# Patient Record
Sex: Female | Born: 1980 | Race: White | Hispanic: No | Marital: Married | Smoking: Former smoker
Health system: Southern US, Community
[De-identification: ages and names within clinical notes are randomized; demographics above are authoritative.]

## PROBLEM LIST (undated history)

## (undated) DIAGNOSIS — B9689 Other specified bacterial agents as the cause of diseases classified elsewhere: Secondary | ICD-10-CM

## (undated) DIAGNOSIS — R87629 Unspecified abnormal cytological findings in specimens from vagina: Secondary | ICD-10-CM

## (undated) DIAGNOSIS — Z87898 Personal history of other specified conditions: Secondary | ICD-10-CM

## (undated) DIAGNOSIS — K649 Unspecified hemorrhoids: Secondary | ICD-10-CM

## (undated) DIAGNOSIS — N760 Acute vaginitis: Secondary | ICD-10-CM

## (undated) DIAGNOSIS — G43909 Migraine, unspecified, not intractable, without status migrainosus: Secondary | ICD-10-CM

## (undated) DIAGNOSIS — K589 Irritable bowel syndrome without diarrhea: Secondary | ICD-10-CM

## (undated) DIAGNOSIS — I10 Essential (primary) hypertension: Secondary | ICD-10-CM

## (undated) DIAGNOSIS — N898 Other specified noninflammatory disorders of vagina: Principal | ICD-10-CM

## (undated) DIAGNOSIS — K5900 Constipation, unspecified: Secondary | ICD-10-CM

## (undated) DIAGNOSIS — Z309 Encounter for contraceptive management, unspecified: Secondary | ICD-10-CM

## (undated) DIAGNOSIS — Z975 Presence of (intrauterine) contraceptive device: Secondary | ICD-10-CM

## (undated) DIAGNOSIS — B379 Candidiasis, unspecified: Secondary | ICD-10-CM

## (undated) DIAGNOSIS — A498 Other bacterial infections of unspecified site: Secondary | ICD-10-CM

## (undated) DIAGNOSIS — F988 Other specified behavioral and emotional disorders with onset usually occurring in childhood and adolescence: Secondary | ICD-10-CM

## (undated) DIAGNOSIS — IMO0002 Reserved for concepts with insufficient information to code with codable children: Secondary | ICD-10-CM

## (undated) DIAGNOSIS — L409 Psoriasis, unspecified: Secondary | ICD-10-CM

## (undated) DIAGNOSIS — N9489 Other specified conditions associated with female genital organs and menstrual cycle: Principal | ICD-10-CM

## (undated) DIAGNOSIS — R87619 Unspecified abnormal cytological findings in specimens from cervix uteri: Secondary | ICD-10-CM

## (undated) HISTORY — DX: Migraine, unspecified, not intractable, without status migrainosus: G43.909

## (undated) HISTORY — DX: Unspecified hemorrhoids: K64.9

## (undated) HISTORY — PX: AUGMENTATION MAMMAPLASTY: SUR837

## (undated) HISTORY — DX: Other specified behavioral and emotional disorders with onset usually occurring in childhood and adolescence: F98.8

## (undated) HISTORY — PX: FOOT SURGERY: SHX648

## (undated) HISTORY — DX: Candidiasis, unspecified: B37.9

## (undated) HISTORY — DX: Constipation, unspecified: K59.00

## (undated) HISTORY — DX: Encounter for contraceptive management, unspecified: Z30.9

## (undated) HISTORY — DX: Unspecified abnormal cytological findings in specimens from vagina: R87.629

## (undated) HISTORY — PX: BREAST SURGERY: SHX581

## (undated) HISTORY — DX: Personal history of other specified conditions: Z87.898

## (undated) HISTORY — DX: Other specified conditions associated with female genital organs and menstrual cycle: N94.89

## (undated) HISTORY — DX: Essential (primary) hypertension: I10

## (undated) HISTORY — DX: Psoriasis, unspecified: L40.9

## (undated) HISTORY — DX: Acute vaginitis: N76.0

## (undated) HISTORY — DX: Presence of (intrauterine) contraceptive device: Z97.5

## (undated) HISTORY — DX: Irritable bowel syndrome, unspecified: K58.9

## (undated) HISTORY — DX: Unspecified abnormal cytological findings in specimens from cervix uteri: R87.619

## (undated) HISTORY — DX: Other specified bacterial agents as the cause of diseases classified elsewhere: B96.89

## (undated) HISTORY — DX: Other specified noninflammatory disorders of vagina: N89.8

## (undated) HISTORY — DX: Other bacterial infections of unspecified site: A49.8

## (undated) HISTORY — DX: Reserved for concepts with insufficient information to code with codable children: IMO0002

---

## 2002-02-06 ENCOUNTER — Other Ambulatory Visit: Admission: RE | Admit: 2002-02-06 | Discharge: 2002-02-06 | Payer: Self-pay | Admitting: *Deleted

## 2002-07-26 ENCOUNTER — Ambulatory Visit (HOSPITAL_COMMUNITY): Admission: RE | Admit: 2002-07-26 | Discharge: 2002-07-26 | Payer: Self-pay | Admitting: Family Medicine

## 2002-07-26 ENCOUNTER — Encounter: Payer: Self-pay | Admitting: Family Medicine

## 2002-09-26 ENCOUNTER — Other Ambulatory Visit: Admission: RE | Admit: 2002-09-26 | Discharge: 2002-09-26 | Payer: Self-pay | Admitting: *Deleted

## 2008-08-21 ENCOUNTER — Other Ambulatory Visit: Admission: RE | Admit: 2008-08-21 | Discharge: 2008-08-21 | Payer: Self-pay | Admitting: Obstetrics and Gynecology

## 2009-10-07 ENCOUNTER — Other Ambulatory Visit: Admission: RE | Admit: 2009-10-07 | Discharge: 2009-10-07 | Payer: Self-pay | Admitting: Obstetrics and Gynecology

## 2011-05-24 ENCOUNTER — Other Ambulatory Visit (HOSPITAL_COMMUNITY)
Admission: RE | Admit: 2011-05-24 | Discharge: 2011-05-24 | Disposition: A | Discharge status: Home / Self Care | Payer: BC Managed Care – PPO | Source: Ambulatory Visit | Attending: Obstetrics and Gynecology | Admitting: Obstetrics and Gynecology

## 2011-05-24 DIAGNOSIS — R87810 Cervical high risk human papillomavirus (HPV) DNA test positive: Secondary | ICD-10-CM | POA: Insufficient documentation

## 2011-05-24 DIAGNOSIS — Z01419 Encounter for gynecological examination (general) (routine) without abnormal findings: Secondary | ICD-10-CM | POA: Insufficient documentation

## 2012-07-27 ENCOUNTER — Other Ambulatory Visit (HOSPITAL_COMMUNITY)
Admission: RE | Admit: 2012-07-27 | Discharge: 2012-07-27 | Disposition: A | Discharge status: Home / Self Care | Payer: BC Managed Care – PPO | Source: Ambulatory Visit | Attending: Obstetrics and Gynecology | Admitting: Obstetrics and Gynecology

## 2012-07-27 DIAGNOSIS — Z01419 Encounter for gynecological examination (general) (routine) without abnormal findings: Secondary | ICD-10-CM | POA: Insufficient documentation

## 2012-07-27 DIAGNOSIS — Z1151 Encounter for screening for human papillomavirus (HPV): Secondary | ICD-10-CM | POA: Insufficient documentation

## 2013-08-08 ENCOUNTER — Encounter: Payer: Self-pay | Admitting: Adult Health

## 2013-08-08 ENCOUNTER — Ambulatory Visit (INDEPENDENT_AMBULATORY_CARE_PROVIDER_SITE_OTHER): Payer: BC Managed Care – PPO | Admitting: Adult Health

## 2013-08-08 ENCOUNTER — Other Ambulatory Visit: Payer: Self-pay | Admitting: Adult Health

## 2013-08-08 VITALS — BP 138/88 | HR 72 | Ht 65.0 in | Wt 137.0 lb

## 2013-08-08 DIAGNOSIS — G43909 Migraine, unspecified, not intractable, without status migrainosus: Secondary | ICD-10-CM

## 2013-08-08 DIAGNOSIS — Z01419 Encounter for gynecological examination (general) (routine) without abnormal findings: Secondary | ICD-10-CM

## 2013-08-08 DIAGNOSIS — Z309 Encounter for contraceptive management, unspecified: Secondary | ICD-10-CM

## 2013-08-08 DIAGNOSIS — Z87898 Personal history of other specified conditions: Secondary | ICD-10-CM

## 2013-08-08 DIAGNOSIS — K589 Irritable bowel syndrome without diarrhea: Secondary | ICD-10-CM | POA: Insufficient documentation

## 2013-08-08 HISTORY — DX: Encounter for contraceptive management, unspecified: Z30.9

## 2013-08-08 HISTORY — DX: Personal history of other specified conditions: Z87.898

## 2013-08-08 MED ORDER — NORETHIN-ETH ESTRAD-FE BIPHAS 1 MG-10 MCG / 10 MCG PO TABS: 1.0000 | ORAL_TABLET | Freq: Every day | ORAL | Status: DC

## 2013-08-08 NOTE — Patient Instructions (Addendum)
Diet and Irritable Bowel Syndrome  No cure has been found for irritable bowel syndrome (IBS). Many options are available to treat the symptoms. Your caregiver will give you the best treatments available for your symptoms. He or she will also encourage you to manage stress and to make changes to your diet. You need to work with your caregiver and Registered Dietician to find the best combination of medicine, diet, counseling, and support to control your symptoms. The following are some diet suggestions. FOODS THAT MAKE IBS WORSE Fatty foods, such as [place] fries. Milk products, such as cheese or ice cream. Chocolate. Alcohol. Caffeine (found in coffee and some sodas). Carbonated drinks, such as soda. If certain foods cause symptoms, you should eat less of them or stop eating them. FOOD JOURNAL  Keep a journal of the foods that seem to cause distress. Write down: What you are eating during the day and when. What problems you are having after eating. When the symptoms occur in relation to your meals. What foods always make you feel badly. Take your notes with you to your caregiver to see if you should stop eating certain foods. FOODS THAT MAKE IBS BETTER Fiber reduces IBS symptoms, especially constipation, because it makes stools soft, bulky, and easier to pass. Fiber is found in bran, bread, cereal, beans, fruit, and vegetables. Examples of foods with fiber include: Apples. Peaches. Pears. Berries. Figs. Broccoli, raw. Cabbage. Carrots. Raw peas. Kidney beans. Lima beans. Whole-grain bread. Whole-grain cereal. Add foods with fiber to your diet a little at a time. This will let your body get used to them. Too much fiber at once might cause gas and swelling of your abdomen. This can trigger symptoms in a person with IBS. Caregivers usually recommend a diet with enough fiber to produce soft, painless bowel movements. High fiber diets may cause gas and bloating. However, these symptoms  often go away within a few weeks, as your body adjusts. In many cases, dietary fiber may lessen IBS symptoms, particularly constipation. However, it may not help pain or diarrhea. High fiber diets keep the colon mildly enlarged (distended) with the added fiber. This may help prevent spasms in the colon. Some forms of fiber also keep water in the stool, thereby preventing hard stools that are difficult to pass.  Besides telling you to eat more foods with fiber, your caregiver may also tell you to get more fiber by taking a fiber pill or drinking water mixed with a special high fiber powder. An example of this is a natural fiber laxative containing psyllium seed.  TIPS Large meals can cause cramping and diarrhea in people with IBS. If this happens to you, try eating 4 or 5 small meals a day, or try eating less at each of your usual 3 meals. It may also help if your meals are low in fat and high in carbohydrates. Examples of carbohydrates are pasta, rice, whole-grain breads and cereals, fruits, and vegetables. If dairy products cause your symptoms to flare up, you can try eating less of those foods. You might be able to handle yogurt better than other dairy products, because it contains bacteria that helps with digestion. Dairy products are an important source of calcium and other nutrients. If you need to avoid dairy products, be sure to talk with a Registered Dietitian about getting these nutrients through other food sources. Drink enough water and fluids to keep your urine clear or pale yellow. This is important, especially if you have diarrhea. FOR MORE  INFORMATION  International Foundation for Functional Gastrointestinal Disorders: www.iffgd.org  National Digestive Diseases Information Clearinghouse: digestive.StageSync.si Document Released: 02/26/2004 Document Revised: 02/28/2012 Document Reviewed: 11/13/2007 [facility] Patient Information 2014 [place], [place]. Migraine Headache A migraine headache is  an intense, throbbing pain on one or both sides of your head. A migraine can last for 30 minutes to several hours. CAUSES  The exact cause of a migraine headache is not always known. However, a migraine may be caused when nerves in the brain become irritated and release chemicals that cause inflammation. This causes pain. SYMPTOMS  Pain on one or both sides of your head.  Pulsating or throbbing pain.  Severe pain that prevents daily activities.  Pain that is aggravated by any physical activity.  Nausea, vomiting, or both.  Dizziness.  Pain with exposure to bright lights, loud noises, or activity.  General sensitivity to bright lights, loud noises, or smells. Before you get a migraine, you may get warning signs that a migraine is coming (aura). An aura may include:  Seeing flashing lights.  Seeing bright spots, halos, or zig-zag lines.  Having tunnel vision or blurred vision.  Having feelings of numbness or tingling.  Having trouble talking.  Having muscle weakness. MIGRAINE TRIGGERS  Alcohol.  Smoking.  Stress.  Menstruation.  Aged cheeses.  Foods or drinks that contain nitrates, glutamate, aspartame, or tyramine.  Lack of sleep.  Chocolate.  Caffeine.  Hunger.  Physical exertion.  Fatigue.  Medicines used to treat chest pain (nitroglycerine), birth control pills, estrogen, and some blood pressure medicines. DIAGNOSIS  A migraine headache is often diagnosed based on:  Symptoms.  Physical examination.  A CT scan or MRI of your head. TREATMENT Medicines may be given for pain and nausea. Medicines can also be given to help prevent recurrent migraines.  HOME CARE INSTRUCTIONS  Only take over-the-counter or prescription medicines for pain or discomfort as directed by your caregiver. The use of long-term narcotics is not recommended.  Lie down in a dark, quiet room when you have a migraine.  Keep a journal to find out what may trigger your  migraine headaches. For example, write down:  What you eat and drink.  How much sleep you get.  Any change to your diet or medicines.  Limit alcohol consumption.  Quit smoking if you smoke.  Get 7 to 9 hours of sleep, or as recommended by your caregiver.  Limit stress.  Keep lights dim if bright lights bother you and make your migraines worse. SEEK IMMEDIATE MEDICAL CARE IF:   Your migraine becomes severe.  You have a fever.  You have a stiff neck.  You have vision loss.  You have muscular weakness or loss of muscle control.  You start losing your balance or have trouble walking.  You feel faint or pass out.  You have severe symptoms that are different from your first symptoms. MAKE SURE YOU:   Understand these instructions.  Will watch your condition.  Will get help right away if you are not doing well or get worse. Document Released: 12/06/2005 Document Revised: 02/28/2012 Document Reviewed: 11/26/2011 [facility] Patient Information 2014 [place], [place]. Start lo loestrin and use condoms Follow up in 2 months

## 2013-08-08 NOTE — Addendum Note (Signed)
Addended by: Cyril Mourning A on: 08/08/2013 04:55 PM   Modules accepted: Orders, Medications

## 2013-08-08 NOTE — Addendum Note (Signed)
Addended by: Criss Alvine on: 08/08/2013 05:18 PM   Modules accepted: Orders

## 2013-08-08 NOTE — Progress Notes (Signed)
Patient ID: Sharon Bush, female   DOB: [birth year], 32 y.o.   MRN: [id number] History of Present Illness: Sharon Bush is a 32 year old white female married, in for physical.She had a normal pap with negative HPV 8/13. She is the Optician, dispensing at [employer].She is complaining of headaches when she is off her pills,which is oscella, so bad that she goes to bed.She says that she has migraine with ?aura now, some vision change no numbness.   Current Medications, Allergies, Past Medical History, Past Surgical History, Family History and Social History were reviewed in [employer] record.     Review of Systems: Patient denies any blurred vision, shortness of breath, chest pain, abdominal pain, problems with urination, or intercourse. She has IBS with constipation at present but has diarrhea at times.No joint pain or mood swings.See positives in HPI.    Physical Exam:BP 138/88  Pulse 72  Ht 5\' 5"  (1.651 m)  Wt 137 lb (62.143 kg)  BMI 22.8 kg/m2  LMP 07/21/2014BP recheck was 124/88. General:  Well developed, well nourished, no acute distress Skin:  Warm and dry Neck:  Midline trachea, normal thyroid Lungs; Clear to auscultation bilaterally Breast:  No dominant palpable mass, retraction, or nipple discharge,she has breast implants bilaterally Cardiovascular: Regular rate and rhythm Abdomen:  Soft, non tender, no hepatosplenomegaly Pelvic:  External genitalia is normal in appearance.  The vagina is normal in appearance.  The cervix is nullipareous.  Uterus is felt to be normal size, shape, and contour.  No  adnexal masses or tenderness noted. Extremities:  No swelling or varicosities noted Psych:  Alert and cooperative,seems happy Discussed with Dr Despina Hidden her headaches and pills and wil try lo loestrin x 2 months to see if better.  Impression: Yearly exam-no pap Contraceptive management IBS-C Migraines History of abnormal pap    Plan: Physical in 1 year Start lo  loestrin this Sunday and use condoms Number of samples 2 Lot number [id number]  Exp date 9/15. Return in 2 months for follow up Try bene fiber or 1/2 cup applesauce, 1/2 cup prune juice and 1/2 cup real oatmeal. Review handouts on headaches and IBS

## 2013-10-02 ENCOUNTER — Encounter: Payer: Self-pay | Admitting: Adult Health

## 2013-10-02 ENCOUNTER — Ambulatory Visit (INDEPENDENT_AMBULATORY_CARE_PROVIDER_SITE_OTHER): Payer: BC Managed Care – PPO | Admitting: Adult Health

## 2013-10-02 VITALS — BP 120/80 | Ht 65.5 in | Wt 138.0 lb

## 2013-10-02 DIAGNOSIS — Z3049 Encounter for surveillance of other contraceptives: Secondary | ICD-10-CM

## 2013-10-02 DIAGNOSIS — Z309 Encounter for contraceptive management, unspecified: Secondary | ICD-10-CM

## 2013-10-02 NOTE — Progress Notes (Signed)
Subjective:     Patient ID: Sharon Bush, female   DOB: 03-Mar-1981, 32 y.o.   MRN: [id number]  HPI Sharon Bush is back in follow up of changing pills.She is taking lo loestrin and has not had a head or PMS but has been moody, decrease in libido, just not her self.Not interested in children right now, going to get her doctorate.  Review of Systems See HPI  Reviewed past medical,surgical, social and family history. Reviewed medications and allergies.     Objective:   Physical Exam BP 120/80  Ht 5' 5.5" (1.664 m)  Wt 138 lb (62.596 kg)  BMI 22.61 kg/m2  LMP 09/07/2013   pt wants to change birth control discussed other pills, IUD and nexplanon and she thinks maybe IUD I gave her a pack of minastrin,lot [id number],exp 2/16, and the brochure on mirena IUD  Assessment:     Contraceptive management    Plan:     Call with menses for IUD or start minastrin after this pack of pills Review handout on IUD and talk with husband

## 2013-10-02 NOTE — Patient Instructions (Signed)

## 2013-10-08 ENCOUNTER — Ambulatory Visit: Payer: BC Managed Care – PPO | Admitting: Adult Health

## 2013-10-09 ENCOUNTER — Encounter (INDEPENDENT_AMBULATORY_CARE_PROVIDER_SITE_OTHER): Payer: Self-pay

## 2013-10-09 ENCOUNTER — Ambulatory Visit: Payer: BC Managed Care – PPO | Admitting: Adult Health

## 2013-10-11 ENCOUNTER — Ambulatory Visit (INDEPENDENT_AMBULATORY_CARE_PROVIDER_SITE_OTHER): Payer: BC Managed Care – PPO | Admitting: Advanced Practice Midwife

## 2013-10-11 ENCOUNTER — Encounter: Payer: Self-pay | Admitting: Advanced Practice Midwife

## 2013-10-11 VITALS — Ht 65.5 in | Wt 136.0 lb

## 2013-10-11 DIAGNOSIS — Z3200 Encounter for pregnancy test, result unknown: Secondary | ICD-10-CM

## 2013-10-11 DIAGNOSIS — Z3202 Encounter for pregnancy test, result negative: Secondary | ICD-10-CM

## 2013-10-11 DIAGNOSIS — Z30430 Encounter for insertion of intrauterine contraceptive device: Secondary | ICD-10-CM

## 2013-10-11 LAB — POCT URINE PREGNANCY: Preg Test, Ur: NEGATIVE

## 2013-10-11 NOTE — Progress Notes (Signed)
Sharon Bush is a 32 y.o. year old Caucasian female Gravida 0 Para 0  who presents for placement of a Mirena IUD. Her LMP was 10/06/13 and her pregnancy test today is negative.  The risks and benefits of the method and placement have been thouroughly reviewed with the patient and all questions were answered.  Specifically the patient is aware of failure rate of 12/998, expulsion of the IUD and of possible perforation.  The patient is aware of irregular bleeding due to the method and understands the incidence of irregular bleeding diminishes with time.  Time out was performed.  A Graves speculum was placed.  The cervix was prepped using Betadine. The uterus was found to be neutral and it sounded to 7 cm.  The cervix was grasped with a tenaculum and the IUD was inserted to 7 cm.  It was pulled back 1 cm and the IUD was disengaged.  The strings were trimmed to 3 cm.  Sonogram was performed and the proper placement of the IUD was verified.  The patient was instructed on signs and symptoms of infection and to check for the strings after each menses or each month.  The patient is to refrain from intercourse for 3 days.  The patient is scheduled for a return appointment after her first menses or 4 weeks.  CRESENZO-DISHMAN,[name] 10/11/2013 3:39 PM

## 2013-10-11 NOTE — Addendum Note (Signed)
Addended by: Jacklyn Shell on: 10/11/2013 03:41 PM   Modules accepted: Orders, Medications

## 2013-11-08 ENCOUNTER — Encounter (INDEPENDENT_AMBULATORY_CARE_PROVIDER_SITE_OTHER): Payer: Self-pay

## 2013-11-08 ENCOUNTER — Ambulatory Visit (INDEPENDENT_AMBULATORY_CARE_PROVIDER_SITE_OTHER): Payer: BC Managed Care – PPO | Admitting: Advanced Practice Midwife

## 2013-11-08 ENCOUNTER — Encounter: Payer: Self-pay | Admitting: Advanced Practice Midwife

## 2013-11-08 VITALS — BP 120/80 | Wt 129.0 lb

## 2013-11-08 DIAGNOSIS — Z30431 Encounter for routine checking of intrauterine contraceptive device: Secondary | ICD-10-CM

## 2013-11-08 NOTE — Progress Notes (Signed)
Sharon Bush 32 y.o. Got IUD 4 weeks ago.  Has had some brown spotting since Monday, but no complaints.  Tried to check strings, but "I didn't know what I was feeling for."   Past Medical History  Diagnosis Date  . Psoriasis   . Abnormal Pap smear   . Migraines   . IBS (irritable bowel syndrome)   . ADD (attention deficit disorder)   . Contraceptive management 08/08/2013  . History of abnormal Pap smear 08/08/2013   Past Surgical History  Procedure Laterality Date  . Foot surgery    . Breast surgery      augmentation    SSE:  Strings visible, wrapped around back of cervix.  A/P:  Normal IUD check.  Aware that she can call for a rx for megace prn heavy bleeding,

## 2014-01-21 ENCOUNTER — Telehealth: Payer: Self-pay | Admitting: Obstetrics and Gynecology

## 2014-01-21 NOTE — Telephone Encounter (Signed)
Left message x 1. JSY 

## 2014-01-21 NOTE — Telephone Encounter (Signed)
Spoke with pt. Is in a bicycle class. Has developed a small bump in vaginal area that is tender. Call transferred to front desk to schedule an appt. JSY

## 2014-01-22 ENCOUNTER — Encounter: Payer: Self-pay | Admitting: Adult Health

## 2014-01-22 ENCOUNTER — Ambulatory Visit (INDEPENDENT_AMBULATORY_CARE_PROVIDER_SITE_OTHER): Payer: BC Managed Care – PPO | Admitting: Adult Health

## 2014-01-22 VITALS — BP 130/82 | Ht 65.5 in | Wt 129.0 lb

## 2014-01-22 DIAGNOSIS — Z30431 Encounter for routine checking of intrauterine contraceptive device: Secondary | ICD-10-CM

## 2014-01-22 DIAGNOSIS — Z975 Presence of (intrauterine) contraceptive device: Secondary | ICD-10-CM | POA: Insufficient documentation

## 2014-01-22 DIAGNOSIS — K649 Unspecified hemorrhoids: Secondary | ICD-10-CM

## 2014-01-22 DIAGNOSIS — Z1212 Encounter for screening for malignant neoplasm of rectum: Secondary | ICD-10-CM

## 2014-01-22 DIAGNOSIS — K589 Irritable bowel syndrome without diarrhea: Secondary | ICD-10-CM | POA: Insufficient documentation

## 2014-01-22 HISTORY — DX: Unspecified hemorrhoids: K64.9

## 2014-01-22 HISTORY — DX: Presence of (intrauterine) contraceptive device: Z97.5

## 2014-01-22 LAB — HEMOCCULT GUIAC POC 1CARD (OFFICE): Fecal Occult Blood, POC: NEGATIVE

## 2014-01-22 MED ORDER — LINACLOTIDE 145 MCG PO CAPS: 145.0000 ug | ORAL_CAPSULE | Freq: Every day | ORAL | Status: DC

## 2014-01-22 NOTE — Patient Instructions (Signed)
Irritable Bowel Syndrome Irritable Bowel Syndrome (IBS) is caused by a disturbance of normal bowel function. Other terms used are spastic colon, mucous colitis, and irritable colon. It does not require surgery, nor does it lead to cancer. There is no cure for IBS. But with proper diet, stress reduction, and medication, you will find that your problems (symptoms) will gradually disappear or improve. IBS is a common digestive disorder. It usually appears in late adolescence or early adulthood. Women develop it twice as often as men. CAUSES  After food has been digested and absorbed in the small intestine, waste material is moved into the colon (large intestine). In the colon, water and salts are absorbed from the undigested products coming from the small intestine. The remaining residue, or fecal material, is held for elimination. Under normal circumstances, gentle, rhythmic contractions on the bowel walls push the fecal material along the colon towards the rectum. In IBS, however, these contractions are irregular and poorly coordinated. The fecal material is either retained too long, resulting in constipation, or expelled too soon, producing diarrhea. SYMPTOMS  The most common symptom of IBS is pain. It is typically in the lower left side of the belly (abdomen). But it may occur anywhere in the abdomen. It can be felt as heartburn, backache, or even as a dull pain in the arms or shoulders. The pain comes from excessive bowel-muscle spasms and from the buildup of gas and fecal material in the colon. This pain: Can range from sharp belly (abdominal) cramps to a dull, continuous ache. Usually worsens soon after eating. Is typically relieved by having a bowel movement or passing gas. Abdominal pain is usually accompanied by constipation. But it may also produce diarrhea. The diarrhea typically occurs right after a meal or upon arising in the morning. The stools are typically soft and watery. They are often  flecked with secretions (mucus). Other symptoms of IBS include: Bloating. Loss of appetite. Heartburn. Feeling sick to your stomach (nausea). Belching Vomiting Gas. IBS may also cause a number of symptoms that are unrelated to the digestive system: Fatigue. Headaches. Anxiety Shortness of breath Difficulty in concentrating. Dizziness. These symptoms tend to come and go. DIAGNOSIS  The symptoms of IBS closely mimic the symptoms of other, more serious digestive disorders. So your caregiver may wish to perform a variety of additional tests to exclude these disorders. He/she wants to be certain of learning what is wrong (diagnosis). The nature and purpose of each test will be explained to you. TREATMENT A number of medications are available to help correct bowel function and/or relieve bowel spasms and abdominal pain. Among the drugs available are: Mild, non-irritating laxatives for severe constipation and to help restore normal bowel habits. Specific anti-diarrheal medications to treat severe or prolonged diarrhea. Anti-spasmodic agents to relieve intestinal cramps. Your caregiver may also decide to treat you with a mild tranquilizer or sedative during unusually stressful periods in your life. The important thing to remember is that if any drug is prescribed for you, make sure that you take it exactly as directed. Make sure that your caregiver knows how well it worked for you. HOME CARE INSTRUCTIONS  Avoid foods that are high in fat or oils. Some examples WGN:FAOZHare:heavy cream, butter, frankfurters, sausage, and other fatty meats. Avoid foods that have a laxative effect, such as fruit, fruit juice, and dairy products. Cut out carbonated drinks, chewing gum, and "gassy" foods, such as beans and cabbage. This may help relieve bloating and belching. Bran taken with plenty  of liquids may help relieve constipation. Keep track of what foods seem to trigger your symptoms. Avoid emotionally charged  situations or circumstances that produce anxiety. Start or continue exercising. Get plenty of rest and sleep. MAKE SURE YOU:  Understand these instructions. Will watch your condition. Will get help right away if you are not doing well or get worse. Document Released: 12/06/2005 Document Revised: 02/28/2012 Document Reviewed: 07/26/2008 [facility] Patient Information 2014 [place], [place]. Hemorrhoids Hemorrhoids are swollen veins around the rectum or anus. There are two types of hemorrhoids:   Internal hemorrhoids. These occur in the veins just inside the rectum. They may poke through to the outside and become irritated and painful.  External hemorrhoids. These occur in the veins outside the anus and can be felt as a painful swelling or hard lump near the anus. CAUSES  Pregnancy.   Obesity.   Constipation or diarrhea.   Straining to have a bowel movement.   Sitting for long periods on the toilet.  Heavy lifting or other activity that caused you to strain.  Anal intercourse. SYMPTOMS   Pain.   Anal itching or irritation.   Rectal bleeding.   Fecal leakage.   Anal swelling.   One or more lumps around the anus.  DIAGNOSIS  Your caregiver may be able to diagnose hemorrhoids by visual examination. Other examinations or tests that may be performed include:   Examination of the rectal area with a gloved hand (digital rectal exam).   Examination of anal canal using a small tube (scope).   A blood test if you have lost a significant amount of blood.  A test to look inside the colon (sigmoidoscopy or colonoscopy). TREATMENT Most hemorrhoids can be treated at home. However, if symptoms do not seem to be getting better or if you have a lot of rectal bleeding, your caregiver may perform a procedure to help make the hemorrhoids get smaller or remove them completely. Possible treatments include:   Placing a rubber band at the base of the hemorrhoid to cut off the  circulation (rubber band ligation).   Injecting a chemical to shrink the hemorrhoid (sclerotherapy).   Using a tool to burn the hemorrhoid (infrared light therapy).   Surgically removing the hemorrhoid (hemorrhoidectomy).   Stapling the hemorrhoid to block blood flow to the tissue (hemorrhoid stapling).  HOME CARE INSTRUCTIONS   Eat foods with fiber, such as whole grains, beans, nuts, fruits, and vegetables. Ask your doctor about taking products with added fiber in them (fibersupplements).  Increase fluid intake. Drink enough water and fluids to keep your urine clear or pale yellow.   Exercise regularly.   Go to the bathroom when you have the urge to have a bowel movement. Do not wait.   Avoid straining to have bowel movements.   Keep the anal area dry and clean. Use wet toilet paper or moist towelettes after a bowel movement.   Medicated creams and suppositories may be used or applied as directed.   Only take over-the-counter or prescription medicines as directed by your caregiver.   Take warm sitz baths for 15 20 minutes, 3 4 times a day to ease pain and discomfort.   Place ice packs on the hemorrhoids if they are tender and swollen. Using ice packs between sitz baths may be helpful.   Put ice in a plastic bag.   Place a towel between your skin and the bag.   Leave the ice on for 15 20 minutes, 3 4 times a  day.   Do not use a donut-shaped pillow or sit on the toilet for long periods. This increases blood pooling and pain.  SEEK MEDICAL CARE IF:  You have increasing pain and swelling that is not controlled by treatment or medicine.  You have uncontrolled bleeding.  You have difficulty or you are unable to have a bowel movement.  You have pain or inflammation outside the area of the hemorrhoids. MAKE SURE YOU:  Understand these instructions.  Will watch your condition.  Will get help right away if you are not doing well or get worse. Document  Released: 12/03/2000 Document Revised: 11/22/2012 Document Reviewed: 10/10/2012 [facility] Patient Information 2014 [place], [place]. Try linzess  Call prn

## 2014-01-22 NOTE — Progress Notes (Signed)
Subjective:     Patient ID: Sharon MiresLeslie Coleman, female   DOB: 02/01/1981, 33 y.o.   MRN: [id number]  HPI Verlon AuLeslie is a 33 year old white female in complaining of sore spot left labia after spin class and ?hemorrhoid and IBS with severe constipation.Has had elevated LFTs and has stopped MTX. Will get labs next week, also MD may try humira.  Review of Systems See HPI Reviewed past medical,surgical, social and family history. Reviewed medications and allergies.     Objective:   Physical Exam BP 130/82  Ht 5' 5.5" (1.664 m)  Wt 129 lb (58.514 kg)  BMI 21.13 kg/m2   Skin warm and dry.Pelvic: external genitalia is normal in appearance, no lesions or bumps, vagina:  Scant discharge without odor, cervix:smooth, +IUD strings at os, uterus: normal size, shape and contour, non tender, no masses felt, adnexa: no masses or tenderness noted.On rectal exam + internal hemorrhoids with negative hemoccult.  Assessment:     IUD in place Hemorrhoids IBS-constipation    Plan:     Try linzess 145 mcg daily Number of samples 20  Lot number [id number] Exp date 3/15   Wear padded spin shorts Review handout on hemorrhoids and IBS and hemorrhoid banding Call in follow up, nay refer back to Dr Jena Gaussourk

## 2014-02-27 ENCOUNTER — Telehealth: Payer: Self-pay | Admitting: *Deleted

## 2014-02-27 MED ORDER — LINACLOTIDE 145 MCG PO CAPS: 145.0000 ug | ORAL_CAPSULE | Freq: Every day | ORAL | Status: DC

## 2014-02-27 NOTE — Telephone Encounter (Signed)
linzess works well will refill

## 2014-08-19 ENCOUNTER — Ambulatory Visit (INDEPENDENT_AMBULATORY_CARE_PROVIDER_SITE_OTHER): Payer: BC Managed Care – PPO | Admitting: Adult Health

## 2014-08-19 ENCOUNTER — Encounter: Payer: Self-pay | Admitting: Adult Health

## 2014-08-19 VITALS — BP 130/90 | HR 78 | Ht 64.5 in | Wt 135.5 lb

## 2014-08-19 DIAGNOSIS — Z01419 Encounter for gynecological examination (general) (routine) without abnormal findings: Secondary | ICD-10-CM

## 2014-08-19 DIAGNOSIS — K5900 Constipation, unspecified: Secondary | ICD-10-CM

## 2014-08-19 DIAGNOSIS — Z975 Presence of (intrauterine) contraceptive device: Secondary | ICD-10-CM

## 2014-08-19 HISTORY — DX: Constipation, unspecified: K59.00

## 2014-08-19 MED ORDER — LINACLOTIDE 145 MCG PO CAPS: 145.0000 ug | ORAL_CAPSULE | Freq: Every day | ORAL | Status: DC

## 2014-08-19 NOTE — Progress Notes (Signed)
Patient ID: Sharon Bush, female   DOB: February 23, 1981, 33 y.o.   MRN: [id number] History of Present Illness: Sharon Bush is a 33 year old white female married in for physical, she had a normal pap with negative HPV 07/27/12.Linzess is helping with constipation.   Current Medications, Allergies, Past Medical History, Past Surgical History, Family History and Social History were reviewed in [employer] record.     Review of Systems: patient denies any headaches, blurred vision, shortness of breath, chest pain, abdominal pain, problems with bowel movements, urination, or intercourse. No joint pain or mood swings.    Physical Exam:BP 130/90  Pulse 78  Ht 5' 4.5" (1.638 m)  Wt 135 lb 8 oz (61.462 kg)  BMI 22.91 kg/m2 General:  Well developed, well nourished, no acute distress Skin:  Warm and dry Neck:  Midline trachea, normal thyroid Lungs; Clear to auscultation bilaterally Breast:  No dominant palpable mass, retraction, or nipple discharge, has bilateral implants  Cardiovascular: Regular rate and rhythm Abdomen:  Soft, non tender, no hepatosplenomegaly Pelvic:  External genitalia is normal in appearance.  The vagina is normal in appearance. The cervix is nulliparous, +IUD strings.  Uterus is felt to be normal size, shape, and contour.  No  adnexal masses or tenderness noted. Extremities:  No swelling or varicosities noted Psych:  No mood changes, alert and cooperative, seems happy   Impression: Yearly gyn exam no pap IUD in place Constipation     Plan: Refilled linzess 145 mcg #30 1 daily with 11 refills Physical and pap in one year Labs at school Mammogram at 39 Check BP at school

## 2014-08-19 NOTE — Patient Instructions (Signed)
Physical and pap in 1 year Mammogram at 22

## 2014-09-10 ENCOUNTER — Telehealth: Payer: Self-pay | Admitting: Adult Health

## 2014-09-10 NOTE — Telephone Encounter (Signed)
Has IUD and had dark brown discharge, no pain or odor, reassured this can happen with IUD and should clear up on its own, if not call me back.

## 2014-12-09 ENCOUNTER — Telehealth: Payer: Self-pay | Admitting: Adult Health

## 2014-12-09 MED ORDER — METRONIDAZOLE 0.75 % VA GEL: VAGINAL | Status: DC

## 2014-12-09 NOTE — Telephone Encounter (Signed)
Has discharge with IUD it is brown, will try Metrogel, if not tcall

## 2014-12-23 ENCOUNTER — Telehealth: Payer: Self-pay | Admitting: Adult Health

## 2014-12-23 MED ORDER — FLUCONAZOLE 150 MG PO TABS: ORAL_TABLET | ORAL | Status: DC

## 2014-12-23 NOTE — Telephone Encounter (Signed)
Spoke with pt. Pt was prescribed Metrogel and now has a yeast infection. Can you order Diflucan? Thanks!! JSY

## 2014-12-23 NOTE — Telephone Encounter (Signed)
Will rx diflucan  

## 2015-01-07 ENCOUNTER — Ambulatory Visit (INDEPENDENT_AMBULATORY_CARE_PROVIDER_SITE_OTHER): Payer: BC Managed Care – PPO | Admitting: Adult Health

## 2015-01-07 ENCOUNTER — Encounter: Payer: Self-pay | Admitting: Adult Health

## 2015-01-07 VITALS — BP 140/90 | Ht 65.5 in | Wt 140.5 lb

## 2015-01-07 DIAGNOSIS — N898 Other specified noninflammatory disorders of vagina: Secondary | ICD-10-CM

## 2015-01-07 DIAGNOSIS — Z975 Presence of (intrauterine) contraceptive device: Secondary | ICD-10-CM

## 2015-01-07 HISTORY — DX: Other specified noninflammatory disorders of vagina: N89.8

## 2015-01-07 LAB — POCT WET PREP (WET MOUNT)

## 2015-01-07 MED ORDER — NORETHIN ACE-ETH ESTRAD-FE 1-20 MG-MCG(24) PO CHEW: CHEWABLE_TABLET | ORAL | Status: DC

## 2015-01-07 NOTE — Patient Instructions (Signed)
Take the minastrin daily Return in 2 months for pap and physical

## 2015-01-07 NOTE — Progress Notes (Signed)
Subjective:     Patient ID: Sharon MiresLeslie Bush, female   DOB: 06/17/1981, 34 y.o.   MRN: [id number]  HPI Sharon Bush is a [age] year old white female in complaining of vaginal discharge/spotting with IUD, has had IUD since 2014.Metrogel cleared it up.May have brown stringy discharge for 1-2 weeks.  Review of Systems See HPI Reviewed past medical,surgical, social and family history. Reviewed medications and allergies.     Objective:   Physical Exam BP 140/90 mmHg  Ht 5' 5.5" (1.664 m)  Wt 140 lb 8 oz (63.73 kg)  BMI 23.02 kg/m2  LMP 12/31/2014   Skin warm and dry.Pelvic: external genitalia is normal in appearance, vagina: scant tan discharge without odor, cervix:smooth,+IUD strings, uterus: normal size, shape and contour, non tender, no masses felt, adnexa: no masses or tenderness noted. Wet prep:negative Explained this may be her period, will try OCs to see if gets better.  Assessment:     Vaginal discharge IUD in place    Plan:     Given 84 tabs(3Packs) of minastrin lot #[id number] A exp 7/16 take 1 daily  Return in in 2 months for pap and physical Keep calendar of discharge/spotting

## 2015-03-18 ENCOUNTER — Other Ambulatory Visit (HOSPITAL_COMMUNITY)
Admission: RE | Admit: 2015-03-18 | Discharge: 2015-03-18 | Disposition: A | Discharge status: Home / Self Care | Payer: BC Managed Care – PPO | Source: Ambulatory Visit | Attending: Adult Health | Admitting: Adult Health

## 2015-03-18 ENCOUNTER — Ambulatory Visit (INDEPENDENT_AMBULATORY_CARE_PROVIDER_SITE_OTHER): Payer: BC Managed Care – PPO | Admitting: Adult Health

## 2015-03-18 ENCOUNTER — Encounter: Payer: Self-pay | Admitting: Adult Health

## 2015-03-18 VITALS — BP 124/80 | HR 104 | Ht 64.5 in | Wt 139.5 lb

## 2015-03-18 DIAGNOSIS — Z30432 Encounter for removal of intrauterine contraceptive device: Secondary | ICD-10-CM | POA: Diagnosis not present

## 2015-03-18 DIAGNOSIS — Z1151 Encounter for screening for human papillomavirus (HPV): Secondary | ICD-10-CM | POA: Diagnosis present

## 2015-03-18 DIAGNOSIS — B379 Candidiasis, unspecified: Secondary | ICD-10-CM

## 2015-03-18 DIAGNOSIS — Z3041 Encounter for surveillance of contraceptive pills: Secondary | ICD-10-CM

## 2015-03-18 DIAGNOSIS — Z01419 Encounter for gynecological examination (general) (routine) without abnormal findings: Secondary | ICD-10-CM | POA: Insufficient documentation

## 2015-03-18 DIAGNOSIS — N898 Other specified noninflammatory disorders of vagina: Secondary | ICD-10-CM

## 2015-03-18 HISTORY — DX: Candidiasis, unspecified: B37.9

## 2015-03-18 LAB — POCT WET PREP (WET MOUNT)

## 2015-03-18 MED ORDER — NORETHIN ACE-ETH ESTRAD-FE 1-20 MG-MCG(24) PO CHEW: CHEWABLE_TABLET | ORAL | Status: DC

## 2015-03-18 MED ORDER — FLUCONAZOLE 150 MG PO TABS: ORAL_TABLET | ORAL | Status: DC

## 2015-03-18 NOTE — Progress Notes (Signed)
Patient ID: Sharon Bush, female   DOB: 10/30/1981, 34 y.o.   MRN: [id number] History of Present Illness: Sharon Bush is a 34 year old white female, married, in for well woman gyn exam and pap.She has been on Mnastrin for irregular bleeding with IUD and it is better, but she wants the IUD removed.She has sinus issues and sneezing and saw PCP today. She thinks may have yeast infection feels itchy.  Current Medications, Allergies, Past Medical History, Past Surgical History, Family History and Social History were reviewed in [name].     Review of Systems: Patient denies any headaches, hearing loss, fatigue, blurred vision, shortness of breath, chest pain, abdominal pain, problems with bowel movements, urination, or intercourse. No joint pain or mood swings.See HPI for positives.    Physical Exam:BP 124/80 mmHg  Pulse 104  Ht 5' 4.5" (1.638 m)  Wt 139 lb 8 oz (63.277 kg)  BMI 23.58 kg/m2  LMP 03/09/2015 General:  Well developed, well nourished, no acute distress Skin:  Warm and dry Neck:  Midline trachea, normal thyroid, good ROM, no lymphadenopathy Lungs; Clear to auscultation bilaterally Breast:  No dominant palpable mass, retraction, or nipple discharge, has bilateral breast implants Cardiovascular: Regular rate and rhythm Abdomen:  Soft, non tender, no hepatosplenomegaly Pelvic:  External genitalia is normal in appearance, no lesions.  The vagina is normal in appearance, with good color, moisture and rugae, has white clumpy discharge. Urethra has no lesions or masses. The cervix is smooth with +IUD stings, pap performed with HPV and IUD strings grasped with forceps and easily removed .  Uterus is felt to be normal size, shape, and contour.  No adnexal masses or tenderness noted.Bladder is non tender, no masses felt.Wet prep:+ yeast and few WBCs Extremities/musculoskeletal:  No swelling or varicosities noted, no clubbing or cyanosis Psych:  No mood changes,  alert and cooperative,seems happy   Impression: Well woman gyn exam with pap  Vaginal discharge  Yeast Contraceptive management IUD removal  Plan: Refilled minastrin x 1 year Rx diflucan 150 mg #2 1 now and 1 in 3 days with 1 refill Physical in 1 year Use condoms when on antibiotics to be safe

## 2015-03-18 NOTE — Patient Instructions (Signed)
Take minastrin Physical in 1 year Use condoms when on antibiotics

## 2015-03-21 LAB — CYTOLOGY - PAP

## 2015-03-24 ENCOUNTER — Telehealth: Payer: Self-pay | Admitting: Adult Health

## 2015-03-24 NOTE — Telephone Encounter (Signed)
Spoke with pt. Pt had IUD removed last week. Is on Minastrin. Has had brown/red spotting that started Friday. I advised it can be normal once you've had IUD removed to have some spotting. I advised to give it a few more days. Pt to call back Thursday if still with spotting. JSY

## 2015-03-27 ENCOUNTER — Telehealth: Payer: Self-pay | Admitting: Adult Health

## 2015-03-27 NOTE — Telephone Encounter (Signed)
Had some spotting after IUD removal ,is taking minastrin, reassured to just watch for now

## 2015-10-22 ENCOUNTER — Telehealth: Payer: Self-pay | Admitting: Adult Health

## 2015-10-22 NOTE — Telephone Encounter (Signed)
Left message letting pt know to try Monistat per JAG. If she don't want to do that, can call office for appt. JSY

## 2016-01-11 ENCOUNTER — Other Ambulatory Visit: Payer: Self-pay | Admitting: Adult Health

## 2016-03-02 ENCOUNTER — Other Ambulatory Visit: Payer: Self-pay | Admitting: Adult Health

## 2016-05-03 ENCOUNTER — Other Ambulatory Visit: Payer: BC Managed Care – PPO | Admitting: Adult Health

## 2016-05-20 ENCOUNTER — Ambulatory Visit (INDEPENDENT_AMBULATORY_CARE_PROVIDER_SITE_OTHER): Payer: BC Managed Care – PPO | Admitting: Adult Health

## 2016-05-20 ENCOUNTER — Encounter: Payer: Self-pay | Admitting: Adult Health

## 2016-05-20 VITALS — BP 126/76 | HR 81 | Ht 65.5 in | Wt 148.5 lb

## 2016-05-20 DIAGNOSIS — A499 Bacterial infection, unspecified: Secondary | ICD-10-CM | POA: Diagnosis not present

## 2016-05-20 DIAGNOSIS — N898 Other specified noninflammatory disorders of vagina: Secondary | ICD-10-CM

## 2016-05-20 DIAGNOSIS — Z01411 Encounter for gynecological examination (general) (routine) with abnormal findings: Secondary | ICD-10-CM

## 2016-05-20 DIAGNOSIS — N760 Acute vaginitis: Secondary | ICD-10-CM

## 2016-05-20 DIAGNOSIS — Z01419 Encounter for gynecological examination (general) (routine) without abnormal findings: Secondary | ICD-10-CM

## 2016-05-20 DIAGNOSIS — B9689 Other specified bacterial agents as the cause of diseases classified elsewhere: Secondary | ICD-10-CM

## 2016-05-20 DIAGNOSIS — Z3041 Encounter for surveillance of contraceptive pills: Secondary | ICD-10-CM

## 2016-05-20 HISTORY — DX: Other specified bacterial agents as the cause of diseases classified elsewhere: B96.89

## 2016-05-20 LAB — POCT WET PREP (WET MOUNT): WBC, Wet Prep HPF POC: POSITIVE

## 2016-05-20 MED ORDER — METRONIDAZOLE 0.75 % VA GEL: 1.0000 | Freq: Every day | VAGINAL | Status: DC

## 2016-05-20 MED ORDER — LINACLOTIDE 145 MCG PO CAPS: 145.0000 ug | ORAL_CAPSULE | Freq: Every day | ORAL | Status: DC

## 2016-05-20 MED ORDER — NORETHIN ACE-ETH ESTRAD-FE 1-20 MG-MCG(24) PO CHEW: CHEWABLE_TABLET | ORAL | Status: DC

## 2016-05-20 NOTE — Patient Instructions (Signed)
Physical in 1 year Pap 2019 Use metrogel Bacterial Vaginosis Bacterial vaginosis is a vaginal infection that occurs when the normal balance of bacteria in the vagina is disrupted. It results from an overgrowth of certain bacteria. This is the most common vaginal infection in women of childbearing age. Treatment is important to prevent complications, especially in pregnant women, as it can cause a premature delivery. CAUSES  Bacterial vaginosis is caused by an increase in harmful bacteria that are normally present in smaller amounts in the vagina. Several different kinds of bacteria can cause bacterial vaginosis. However, the reason that the condition develops is not fully understood. RISK FACTORS Certain activities or behaviors can put you at an increased risk of developing bacterial vaginosis, including:  Having a new sex partner or multiple sex partners.  Douching.  Using an intrauterine device (IUD) for contraception. Women do not get bacterial vaginosis from toilet seats, bedding, swimming pools, or contact with objects around them. SIGNS AND SYMPTOMS  Some women with bacterial vaginosis have no signs or symptoms. Common symptoms include:  Grey vaginal discharge.  A fishlike odor with discharge, especially after sexual intercourse.  Itching or burning of the vagina and vulva.  Burning or pain with urination. DIAGNOSIS  Your health care provider will take a medical history and examine the vagina for signs of bacterial vaginosis. A sample of vaginal fluid may be taken. Your health care provider will look at this sample under a microscope to check for bacteria and abnormal cells. A vaginal pH test may also be done.  TREATMENT  Bacterial vaginosis may be treated with antibiotic medicines. These may be given in the form of a pill or a vaginal cream. A second round of antibiotics may be prescribed if the condition comes back after treatment. Because bacterial vaginosis increases your risk  for sexually transmitted diseases, getting treated can help reduce your risk for chlamydia, gonorrhea, HIV, and herpes. HOME CARE INSTRUCTIONS   Only take over-the-counter or prescription medicines as directed by your health care provider.  If antibiotic medicine was prescribed, take it as directed. Make sure you finish it even if you start to feel better.  Tell all sexual partners that you have a vaginal infection. They should see their health care provider and be treated if they have problems, such as a mild rash or itching.  During treatment, it is important that you follow these instructions:  Avoid sexual activity or use condoms correctly.  Do not douche.  Avoid alcohol as directed by your health care provider.  Avoid breastfeeding as directed by your health care provider. SEEK MEDICAL CARE IF:   Your symptoms are not improving after 3 days of treatment.  You have increased discharge or pain.  You have a fever. MAKE SURE YOU:   Understand these instructions.  Will watch your condition.  Will get help right away if you are not doing well or get worse. FOR MORE INFORMATION  Centers for Disease Control and Prevention, Division of STD Prevention: SolutionApps.co.zawww.cdc.gov/std American Sexual Health Association (ASHA): www.ashastd.org    This information is not intended to replace advice given to you by your health care provider. Make sure you discuss any questions you have with your health care provider.   Document Released: 12/06/2005 Document Revised: 12/27/2014 Document Reviewed: 07/18/2013 Elsevier Interactive Patient Education [employer]2016 Elsevier Inc.

## 2016-05-20 NOTE — Progress Notes (Signed)
Patient ID: Sharon Bush, female   DOB: 05/20/1981, 35 y.o.   MRN: [id number] History of Present Illness: Sharon Bush is a 35 year old white female, married in for a well woman gyn exam, she had a normal pap with negative HPV 03/18/15.She is happy with minastrin, she notices odor and discharge at times esp after working out, she wears thongs.She requests refills on linzess it helps with BMs.   Current Medications, Allergies, Past Medical History, Past Surgical History, Family History and Social History were reviewed in [name].     Review of Systems: Patient denies any headaches, hearing loss, fatigue, blurred vision, shortness of breath, chest pain, abdominal pain, problems with bowel movements(constipated at times), urination, or intercourse. No joint pain or mood swings.See HPI for positives.    Physical Exam:BP 126/76 mmHg  Pulse 81  Ht 5' 5.5" (1.664 m)  Wt 148 lb 8 oz (67.359 kg)  BMI 24.33 kg/m2  LMP 04/30/2016 General:  Well developed, well nourished, no acute distress Skin:  Warm and dry Neck:  Midline trachea, normal thyroid, good ROM, no lymphadenopathy Lungs; Clear to auscultation bilaterally Breast:  No dominant palpable mass, retraction, or nipple discharge, has bilateral implants Cardiovascular: Regular rate and rhythm Abdomen:  Soft, non tender, no hepatosplenomegaly Pelvic:  External genitalia is normal in appearance, no lesions.  The vagina has red sidewalls and frothy creamy discharge with slight odor, wet prep: +WBCs and clue cells. Urethra has no lesions or masses. The cervix is everted at os.  Uterus is felt to be normal size, shape, and contour.  No adnexal masses or tenderness noted.Bladder is non tender, no masses felt. Extremities/musculoskeletal:  No swelling or varicosities noted, no clubbing or cyanosis Psych:  No mood changes, alert and cooperative,seems happy   Impression: Well woman gyn exam no pap Contraceptive  management Vaginal discharge  BV    Plan: Rx metrogel use 1 applicator in vagina at HS x 5 nights and 1 refill Review handout on BV Refilled linzess 145 mcg take 1 daily with 6 refills Refilled minastrin x 1 year Physical in 1 year, pap 2019  Do not work out in [employer]

## 2016-05-28 ENCOUNTER — Telehealth: Payer: Self-pay | Admitting: Adult Health

## 2016-05-28 MED ORDER — FLUCONAZOLE 150 MG PO TABS: ORAL_TABLET | ORAL | Status: DC

## 2016-05-28 NOTE — Telephone Encounter (Signed)
Pt called stating that Victorino DikeJennifer has prescribed a medication for her a gel cream, Pt states that this medication is causing her have a yeast infection. Pt would like for Victorino DikeJennifer to call something in for her. Please contact pt.

## 2016-05-28 NOTE — Telephone Encounter (Signed)
Will put refill on diflucan

## 2016-05-28 NOTE — Telephone Encounter (Signed)
Spoke with pt. Pt finished Metrogel Monday and now has a yeast infection. Can you order Diflucan? She has gotten a yeast infection before from Metrogel. Thanks!! JSY

## 2016-06-28 ENCOUNTER — Encounter: Payer: Self-pay | Admitting: Adult Health

## 2016-06-28 ENCOUNTER — Ambulatory Visit (INDEPENDENT_AMBULATORY_CARE_PROVIDER_SITE_OTHER): Payer: BC Managed Care – PPO | Admitting: Adult Health

## 2016-06-28 VITALS — BP 130/84 | HR 90 | Ht 65.5 in | Wt 147.0 lb

## 2016-06-28 DIAGNOSIS — N9489 Other specified conditions associated with female genital organs and menstrual cycle: Secondary | ICD-10-CM | POA: Diagnosis not present

## 2016-06-28 HISTORY — DX: Other specified conditions associated with female genital organs and menstrual cycle: N94.89

## 2016-06-28 NOTE — Progress Notes (Signed)
Subjective:     Patient ID: Sharon MiresLeslie Coleman, female   DOB: 01/25/1981, 35 y.o.   MRN: [id number]  HPI Verlon AuLeslie is a 35 year old white female, in for swelling in right labia area, felt like BB, but it has resolved now.   Review of Systems Swelling right labia area, resolved now Reviewed past medical,surgical, social and family history. Reviewed medications and allergies.     Objective:   Physical Exam BP 130/84 mmHg  Pulse 90  Ht 5' 5.5" (1.664 m)  Wt 147 lb (66.679 kg)  BMI 24.08 kg/m2  LMP 06/25/2016 Skin warm and dry.Pelvic: external genitalia is normal in appearance no lesions, vagina: pink with good moisture and rugae, no lesions or swelling noted,urethra has no lesions or masses noted.    Assessment:    Swelling of labia, resolved    Plan:    Keep clean and dry Follow up prn

## 2016-06-28 NOTE — Patient Instructions (Signed)
Keep clean and dry Follow up prn 

## 2017-01-10 ENCOUNTER — Other Ambulatory Visit: Payer: Self-pay | Admitting: *Deleted

## 2017-01-11 MED ORDER — NORETHIN ACE-ETH ESTRAD-FE 1-20 MG-MCG(24) PO CHEW: CHEWABLE_TABLET | ORAL | 2 refills | Status: DC

## 2017-02-07 ENCOUNTER — Telehealth: Payer: Self-pay | Admitting: Adult Health

## 2017-02-07 NOTE — Telephone Encounter (Signed)
Patient called stating she missed 2 pills on her second week, took a pregnancy test which was negative but is concerned. I advised patient to continue taking her pills as prescribed and to take a pregnancy test in 2 weeks if she felt like she needed too since it may be a little early to show positive. Explained to patient that cycle may be off due to missed pills. Pt verbalized understanding.

## 2017-07-21 ENCOUNTER — Other Ambulatory Visit (HOSPITAL_COMMUNITY)
Admission: RE | Admit: 2017-07-21 | Discharge: 2017-07-21 | Disposition: A | Discharge status: Home / Self Care | Payer: BC Managed Care – PPO | Source: Ambulatory Visit | Attending: Adult Health | Admitting: Adult Health

## 2017-07-21 ENCOUNTER — Encounter: Payer: Self-pay | Admitting: Adult Health

## 2017-07-21 ENCOUNTER — Ambulatory Visit (INDEPENDENT_AMBULATORY_CARE_PROVIDER_SITE_OTHER): Payer: BC Managed Care – PPO | Admitting: Adult Health

## 2017-07-21 VITALS — BP 124/80 | HR 81 | Ht 64.5 in | Wt 154.0 lb

## 2017-07-21 DIAGNOSIS — Z01419 Encounter for gynecological examination (general) (routine) without abnormal findings: Secondary | ICD-10-CM | POA: Diagnosis not present

## 2017-07-21 DIAGNOSIS — Z3041 Encounter for surveillance of contraceptive pills: Secondary | ICD-10-CM | POA: Diagnosis not present

## 2017-07-21 MED ORDER — NORETHIN ACE-ETH ESTRAD-FE 1-20 MG-MCG(24) PO CHEW: CHEWABLE_TABLET | ORAL | 4 refills | Status: DC

## 2017-07-21 NOTE — Progress Notes (Signed)
Patient ID: Sharon Bush, female   DOB: 04/02/1981, 36 y.o.   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  PCP is Dr Sharon Bush, and she sees Dr Sharon Bush.    Current Medications, Allergies, Past Medical History, Past Surgical History, Family History and Social History were reviewed in [name].     Review of Systems:  Patient denies any headaches, hearing loss, fatigue, blurred vision, shortness of breath, chest pain, abdominal pain, problems with bowel movements, urination, or intercourse. No joint pain or mood swings.Has been doing well since IUD removed, no more discharge, and is happy with her OCs.    Physical Exam:BP 124/80 (BP Location: Right Arm, Patient Position: Sitting, Cuff Size: Normal)   Pulse 81   Ht 5' 4.5" (1.638 m)   Wt 154 lb (69.9 kg)   BMI 26.03 kg/m  General:  Well developed, well nourished, no acute distress Skin:  Warm and dry Neck:  Midline trachea, normal thyroid, good ROM, no lymphadenopathy Lungs; Clear to auscultation bilaterally Breast:  No dominant palpable mass, retraction, or nipple discharge Cardiovascular: Regular rate and rhythm Abdomen:  Soft, non tender, no hepatosplenomegaly Pelvic:  External genitalia is normal in appearance, no lesions.  The vagina is normal in appearance. Urethra has no lesions or masses. The cervix is nulliparous, pap with HPV performed.  Uterus is felt to be normal size, shape, and contour.  No adnexal masses or tenderness noted.Bladder is non tender, no masses felt. Extremities/musculoskeletal:  No swelling or varicosities noted, no clubbing or cyanosis Psych:  No mood changes, alert and cooperative,seems happy PHQ 2 score 0.  Impression:  1. Encounter for gynecological examination with Papanicolaou smear of cervix   2. Encounter for surveillance of  contraceptive pills      Plan: Refilled minastrin 24 #84 with 4 refills, take 1 daily Physical in 1 year, pap in 3 if normal  Labs with Dr Sharon Bush Mammogram at 39

## 2017-07-22 LAB — CYTOLOGY - PAP
Adequacy: ABSENT
DIAGNOSIS: NEGATIVE
HPV: NOT DETECTED

## 2017-07-25 ENCOUNTER — Telehealth: Payer: Self-pay | Admitting: Adult Health

## 2017-07-25 NOTE — Telephone Encounter (Signed)
Pt aware pap is negative for malignancy and HPV but had fungus if itching can use OTC monistat but if not, don't worry about it

## 2017-07-29 ENCOUNTER — Telehealth: Payer: Self-pay | Admitting: Adult Health

## 2017-07-29 MED ORDER — FLUCONAZOLE 150 MG PO TABS: ORAL_TABLET | ORAL | 1 refills | Status: DC

## 2017-07-29 NOTE — Telephone Encounter (Signed)
Spoke with pt. Pt is requesting Diflucan for yeast. Thanks!! JSY

## 2017-07-29 NOTE — Telephone Encounter (Signed)
Patient called stating that Victorino DikeJennifer called her regarding her having yeast. Pt states that at the time she did not need the medication but now she would [facility] her [facility] something. Please contact pt

## 2017-07-29 NOTE — Telephone Encounter (Signed)
Will rx diflucan  

## 2018-02-15 ENCOUNTER — Telehealth: Payer: Self-pay | Admitting: Adult Health

## 2018-02-15 NOTE — Telephone Encounter (Signed)
Patient called stating that she has a sore spot where she think its from her pants rubbing while working out, patient would like to speak with a nurse to see what she could use or apply for the sore spot to stop hurting. Please contact pt

## 2018-02-15 NOTE — Telephone Encounter (Signed)
Pt has area that is sore after working out near labia, to come in Monday at 1:30

## 2018-02-15 NOTE — Telephone Encounter (Signed)
Spoke with pt. Pt states she has an area that bothers her when she works out. She states it feels like a bb. She has had this before and it just went away. Pt is concerned about it. Please advise. Thanks!!

## 2018-02-20 ENCOUNTER — Encounter: Payer: Self-pay | Admitting: Adult Health

## 2018-02-20 ENCOUNTER — Ambulatory Visit: Payer: BC Managed Care – PPO | Admitting: Adult Health

## 2018-02-20 ENCOUNTER — Encounter (INDEPENDENT_AMBULATORY_CARE_PROVIDER_SITE_OTHER): Payer: Self-pay

## 2018-02-20 VITALS — BP 150/90 | HR 97 | Ht 65.0 in | Wt 154.0 lb

## 2018-02-20 DIAGNOSIS — G43829 Menstrual migraine, not intractable, without status migrainosus: Secondary | ICD-10-CM

## 2018-02-20 DIAGNOSIS — R030 Elevated blood-pressure reading, without diagnosis of hypertension: Secondary | ICD-10-CM

## 2018-02-20 MED ORDER — HYDROCHLOROTHIAZIDE 25 MG PO TABS: 25.0000 mg | ORAL_TABLET | Freq: Every day | ORAL | 1 refills | Status: DC

## 2018-02-20 NOTE — Progress Notes (Signed)
Subjective:     Patient ID: Sharon Bush, female   DOB: 09/28/1981, 37 y.o.   MRN: [id number]  HPI Sharon Bush is a 37 year old white female in complaining of irritated are left labia when works out, and has headaches when on her period.   Review of Systems Headaches when on period Has irritated area near left labia when works out Reviewed past medical,surgical, social and family history. Reviewed medications and allergies.     Objective:   Physical Exam BP (!) 150/90 (BP Location: Right Arm, Cuff Size: Normal)   Pulse 97   Ht 5\' 5"  (1.651 m)   Wt 154 lb (69.9 kg)   LMP 02/03/2018   BMI 25.63 kg/m  Skin warm and dry. Lungs: clear to ausculation bilaterally. Cardiovascular: regular rate and rhythm.   Pelvic: external genitalia is normal in appearance no lesions,no masses noted. She has not taken any new meds, and is not upset about anything.  Assessment:     1. Menstrual migraine without status migrainosus, not intractable   2. Elevated BP without diagnosis of hypertension       Plan:    Take OCs continuously to see if helps, with headaches Meds ordered this encounter  Medications  . hydrochlorothiazide (HYDRODIURIL) 25 MG tablet    Sig: Take 1 tablet (25 mg total) by mouth daily.    Dispense:  30 tablet    Refill:  1    Order Specific Question:   Supervising Provider    Answer:   Lazaro ArmsEURE, LUTHER H [[id number]]  Don't wear thongs when working out F/U in 3 days for BP check

## 2018-02-21 ENCOUNTER — Telehealth: Payer: Self-pay | Admitting: Adult Health

## 2018-02-21 MED ORDER — LISINOPRIL 10 MG PO TABS: 10.0000 mg | ORAL_TABLET | Freq: Every day | ORAL | 6 refills | Status: DC

## 2018-02-21 NOTE — Telephone Encounter (Signed)
Patient called stating that she was prescribed a medication that may cause an allergic reaction with Sulfa medication. Pt states that she is allergic to Sulfa. Pt states that she checked her blood pressure this morning and it was 192/82. Please contact pt

## 2018-02-21 NOTE — Telephone Encounter (Signed)
Spoke with pt. Pt was placed on HCTZ yesterday. Pharmacy said it may have sulfa in it. Pt didn't start med. BP this am was 129/82. After moving around, BP was 139/92. Pt wonders can she continue to workout. I advised I'm sure she can, but I would ask. Also, pt has headaches from neck pain and wonders if she should continue Advil. Please advise. Thanks!! JSY

## 2018-02-21 NOTE — Telephone Encounter (Signed)
Will D/C hydrodiuril  and rx lisinopril, Ok to work out, make sure hydrated and OK to take tylenol or advil

## 2018-02-23 ENCOUNTER — Ambulatory Visit: Payer: BC Managed Care – PPO | Admitting: Adult Health

## 2018-02-23 ENCOUNTER — Encounter: Payer: Self-pay | Admitting: Adult Health

## 2018-02-23 VITALS — BP 138/84 | HR 108 | Ht 65.5 in | Wt 154.0 lb

## 2018-02-23 DIAGNOSIS — R030 Elevated blood-pressure reading, without diagnosis of hypertension: Secondary | ICD-10-CM | POA: Diagnosis not present

## 2018-02-23 DIAGNOSIS — R51 Headache: Secondary | ICD-10-CM | POA: Diagnosis not present

## 2018-02-23 NOTE — Progress Notes (Signed)
Subjective:     Patient ID: Sharon Bush, female   DOB: 09/02/1981, 37 y.o.   MRN: [id number]  HPI Sharon Bush is a 37 year old white female in for a BP check, was started on lisinopril due to elevated BP in office this week. PCP is Dr Janna ArchonDiego.   Review of Systems Headaches when on period Reviewed past medical,surgical, social and family history. Reviewed medications and allergies.     Objective:   Physical Exam BP 138/84 (BP Location: Left Arm, Patient Position: Sitting, Cuff Size: Normal)   Pulse (!) 108   Ht 5' 5.5" (1.664 m)   Wt 154 lb (69.9 kg)   LMP 02/03/2018   BMI 25.24 kg/m  Skin warm and dry.  Lungs: clear to ausculation bilaterally. Cardiovascular: regular rate and rhythm.  She has kept log of BP this week and ranges from 120/70 to 147/111. She is on adderall, to take with PCP about dose or med change.  Assessment:     1. Elevated BP without diagnosis of hypertension       Plan:    Continue lisinopril Keep BP log F/U in 4 weeks for BP check Talk with Dr Janna ArchonDiego about adderall

## 2018-03-15 ENCOUNTER — Other Ambulatory Visit: Payer: Self-pay | Admitting: Adult Health

## 2018-03-23 ENCOUNTER — Ambulatory Visit: Payer: BC Managed Care – PPO | Admitting: Adult Health

## 2018-03-23 ENCOUNTER — Encounter: Payer: Self-pay | Admitting: Adult Health

## 2018-03-23 VITALS — BP 120/80 | HR 108 | Ht 65.5 in | Wt 148.5 lb

## 2018-03-23 DIAGNOSIS — I10 Essential (primary) hypertension: Secondary | ICD-10-CM

## 2018-03-23 NOTE — Progress Notes (Signed)
Subjective:     Patient ID: Sharon Bush, female   DOB: 06/11/1981, 37 y.o.   MRN: [id number]  HPI Sharon Bush is a 37 year old white female in for BP check  Review of Systems  Doing good, no headaches, taking OCs continuously now BPs 120/79 ish at home Reviewed past medical,surgical, social and family history. Reviewed medications and allergies.     Objective:   Physical Exam BP 120/80 (BP Location: Left Arm, Patient Position: Sitting, Cuff Size: Normal)   Pulse (!) 108   Ht 5' 5.5" (1.664 m)   Wt 148 lb 8 oz (67.4 kg)   BMI 24.34 kg/m  Skin warm and dry. Lungs: clear to ausculation bilaterally. Cardiovascular: regular rate and rhythm.    Assessment:     1. Essential hypertension       Plan:     Continue lisinopril Recheck in 3 months

## 2018-04-14 ENCOUNTER — Telehealth: Payer: Self-pay | Admitting: Adult Health

## 2018-04-14 NOTE — Telephone Encounter (Signed)
Spoke with pt. Pt said Sharon Bush advised to stop taking placebo pills and just do continous pills. She had breakthrough bleeding after missing some pills and then had period. She got a yeast infection and took 1 day Monistat and that has helped. Wanted you to know. JSY

## 2018-04-17 NOTE — Telephone Encounter (Signed)
Called pt back, and left message I called, call me if needed

## 2018-05-30 ENCOUNTER — Ambulatory Visit: Payer: BC Managed Care – PPO | Admitting: Adult Health

## 2018-06-05 ENCOUNTER — Ambulatory Visit: Payer: BC Managed Care – PPO | Admitting: Adult Health

## 2018-06-23 ENCOUNTER — Ambulatory Visit: Payer: BC Managed Care – PPO | Admitting: Adult Health

## 2018-06-23 ENCOUNTER — Encounter: Payer: Self-pay | Admitting: Adult Health

## 2018-06-23 ENCOUNTER — Other Ambulatory Visit: Payer: Self-pay

## 2018-06-23 VITALS — BP 101/57 | HR 76 | Ht 65.5 in | Wt 140.0 lb

## 2018-06-23 DIAGNOSIS — I10 Essential (primary) hypertension: Secondary | ICD-10-CM | POA: Diagnosis not present

## 2018-06-23 MED ORDER — SCOPOLAMINE 1 MG/3DAYS TD PT72: 1.0000 | MEDICATED_PATCH | TRANSDERMAL | 1 refills | Status: DC

## 2018-06-23 NOTE — Progress Notes (Signed)
  Subjective:     Patient ID: Sharon Bush, female   DOB: 01/04/1981, 37 y.o.   MRN: [id number]  HPI Sharon Bush is s 37 year old white female in for BP check, she says she feels great.She is going on cruise and requests patch, so won't get sea sick.   Review of Systems Feels great Patient denies any headaches, hearing loss, fatigue, blurred vision, shortness of breath, chest pain, abdominal pain, problems with bowel movements, urination, or intercourse. No joint pain or mood swings. Reviewed past medical,surgical, social and family history. Reviewed medications and allergies.     Objective:   Physical Exam BP (!) 101/57 (BP Location: Right Arm, Patient Position: Sitting, Cuff Size: Normal)   Pulse 76   Ht 5' 5.5" (1.664 m)   Wt 140 lb (63.5 kg)   BMI 22.94 kg/m   Skin warm and dry.  Lungs: clear to ausculation bilaterally. Cardiovascular: regular rate and rhythm.Has lost 14 lbs since March. Will rx transderm scop.    Assessment:     1. Essential hypertension       Plan:     Continue lisinopril, may reduce if still good in august when gets physical Meds ordered this encounter  Medications  . scopolamine (TRANSDERM-SCOP) 1 MG/3DAYS    Sig: Place 1 patch (1.5 mg total) onto the skin every 3 (three) days.    Dispense:  2 patch    Refill:  1    Order Specific Question:   Supervising Provider    Answer:   Lazaro ArmsEURE, LUTHER H [[id number]  Physical in August

## 2018-07-25 ENCOUNTER — Other Ambulatory Visit: Payer: BC Managed Care – PPO | Admitting: Adult Health

## 2018-07-31 ENCOUNTER — Encounter: Payer: Self-pay | Admitting: Adult Health

## 2018-07-31 ENCOUNTER — Ambulatory Visit (INDEPENDENT_AMBULATORY_CARE_PROVIDER_SITE_OTHER): Payer: BC Managed Care – PPO | Admitting: Adult Health

## 2018-07-31 VITALS — BP 136/90 | HR 91 | Ht 64.5 in | Wt 140.0 lb

## 2018-07-31 DIAGNOSIS — Z01419 Encounter for gynecological examination (general) (routine) without abnormal findings: Secondary | ICD-10-CM

## 2018-07-31 DIAGNOSIS — I10 Essential (primary) hypertension: Secondary | ICD-10-CM | POA: Insufficient documentation

## 2018-07-31 DIAGNOSIS — Z3041 Encounter for surveillance of contraceptive pills: Secondary | ICD-10-CM

## 2018-07-31 MED ORDER — NORETHIN ACE-ETH ESTRAD-FE 1-20 MG-MCG(24) PO CHEW: CHEWABLE_TABLET | ORAL | 4 refills | Status: DC

## 2018-07-31 MED ORDER — FLUCONAZOLE 150 MG PO TABS: ORAL_TABLET | ORAL | 1 refills | Status: DC

## 2018-07-31 MED ORDER — LISINOPRIL 10 MG PO TABS: 10.0000 mg | ORAL_TABLET | Freq: Every day | ORAL | 6 refills | Status: DC

## 2018-07-31 NOTE — Progress Notes (Signed)
Patient ID: Sharon Bush, female   DOB: 06/11/1981, 37 y.o.   MRN: [id number] History of Present Illness: Sharon Bush is a 37 year old white female,married, G0P0 in for well woman gyn exam,she had normal pap with negative HPV 07/21/17. PCP is Sharon Bush.    Current Medications, Allergies, Past Medical History, Past Surgical History, Family History and Social History were reviewed in [name].     Review of Systems: Patient denies any headaches, hearing loss, fatigue, blurred vision, shortness of breath, chest pain, abdominal pain, problems with bowel movements, urination, or intercourse. No joint pain or mood swings.    Physical Exam:BP 136/90 (BP Location: Left Arm, Patient Position: Sitting, Cuff Size: Normal)   Pulse 91   Ht 5' 4.5" (1.638 m)   Wt 140 lb (63.5 kg)   BMI 23.66 kg/m  General:  Well developed, well nourished, no acute distress Skin:  Warm and dry Neck:  Midline trachea, normal thyroid, good ROM, no lymphadenopathy Lungs; Clear to auscultation bilaterally Breast:  No dominant palpable mass, retraction, or nipple discharge Cardiovascular: Regular rate and rhythm Abdomen:  Soft, non tender, no hepatosplenomegaly Pelvic:  External genitalia is normal in appearance, no lesions.  The vagina is normal in appearance. Urethra has no lesions or masses. The cervix is bulbous.  Uterus is felt to be normal size, shape, and contour.  No adnexal masses or tenderness noted.Bladder is non tender, no masses felt. Extremities/musculoskeletal:  No swelling or varicosities noted, no clubbing or cyanosis,both thumb nails has black section from pseudomonas and is on cipro from dermatologist  Psych:  No mood changes, alert and cooperative,seems happy PHQ 2 score 0.  Impression:    ICD-10-CM   1. Encounter for well woman exam with routine gynecological exam Z01.419   2. Encounter for surveillance of contraceptive pills Z30.41   3. Essential hypertension  I10      Plan: Meds ordered this encounter  Medications  . Norethin Ace-Eth Estrad-FE (MINASTRIN 24 FE) 1-20 MG-MCG(24) CHEW    Sig: TAKE 1 DAILY    Dispense:  84 tablet    Refill:  4    Order Specific Question:   Supervising Provider    Answer:   Sharon Bush, Sharon Bush [[id number]]  . lisinopril (PRINIVIL,ZESTRIL) 10 MG tablet    Sig: Take 1 tablet (10 mg total) by mouth daily.    Dispense:  30 tablet    Refill:  6    Order Specific Question:   Supervising Provider    Answer:   Sharon Bush, Sharon Bush [[id number]]  . fluconazole (DIFLUCAN) 150 MG tablet    Sig: Take 1 now and 1 in 3 days if needed    Dispense:  2 tablet    Refill:  1    Order Specific Question:   Supervising Provider    Answer:   Sharon Bush, Sharon Bush [[id number]]  Physical in 1 year Pap in 2021

## 2018-11-13 ENCOUNTER — Other Ambulatory Visit (INDEPENDENT_AMBULATORY_CARE_PROVIDER_SITE_OTHER): Payer: BC Managed Care – PPO

## 2018-11-13 ENCOUNTER — Telehealth: Payer: Self-pay | Admitting: Adult Health

## 2018-11-13 DIAGNOSIS — R3915 Urgency of urination: Secondary | ICD-10-CM | POA: Diagnosis not present

## 2018-11-13 LAB — POCT URINALYSIS DIPSTICK OB
Blood, UA: NEGATIVE
Glucose, UA: NEGATIVE
KETONES UA: NEGATIVE
LEUKOCYTES UA: NEGATIVE
NITRITE UA: NEGATIVE
PROTEIN: NEGATIVE

## 2018-11-13 NOTE — Telephone Encounter (Signed)
Patient called, thinks she has an UTI.  She stated it's not bad.  She did a strip test and it was in the positive range.  She started drinking more water and cranberry juice.  Started taking Azo until she read it can discolor your contacts.  She would like to know what to do.  [facility]  [phone]

## 2018-11-13 NOTE — Progress Notes (Signed)
Pt brought in urine for dip. Urine dip was negative. Pt having urgency. Will send urine to lab for culture. JSY

## 2018-11-13 NOTE — Telephone Encounter (Signed)
Pt called stating that she thinks she has a uti. She c/o urinary frequency and some burning. Advised pt, per Victorino DikeJennifer, she could come and provide us with a urine sample, which we would check and send for culture. Pt verbalized understanding.

## 2018-11-14 LAB — URINALYSIS, ROUTINE W REFLEX MICROSCOPIC
BILIRUBIN UA: NEGATIVE
Glucose, UA: NEGATIVE
Ketones, UA: NEGATIVE
Leukocytes, UA: NEGATIVE
Nitrite, UA: NEGATIVE
PH UA: 6 (ref 5.0–7.5)
PROTEIN UA: NEGATIVE
RBC UA: NEGATIVE
Specific Gravity, UA: 1.015 (ref 1.005–1.030)
UUROB: 0.2 mg/dL (ref 0.2–1.0)

## 2018-11-15 ENCOUNTER — Telehealth: Payer: Self-pay | Admitting: Adult Health

## 2018-11-15 LAB — URINE CULTURE: ORGANISM ID, BACTERIA: NO GROWTH

## 2018-11-15 NOTE — Telephone Encounter (Signed)
Pt aware that urine culture is negative.

## 2019-01-22 ENCOUNTER — Telehealth: Payer: Self-pay | Admitting: Adult Health

## 2019-01-22 ENCOUNTER — Telehealth: Payer: Self-pay | Admitting: *Deleted

## 2019-01-22 MED ORDER — LINACLOTIDE 145 MCG PO CAPS
145.0000 ug | ORAL_CAPSULE | Freq: Every day | ORAL | 3 refills | Status: DC
Start: 2019-01-22 — End: 2019-06-11

## 2019-01-22 MED ORDER — DOXYCYCLINE HYCLATE 100 MG PO TBEC: 100.0000 mg | DELAYED_RELEASE_TABLET | Freq: Two times a day (BID) | ORAL | 0 refills | Status: DC

## 2019-01-22 NOTE — Telephone Encounter (Signed)
On Saturday feels a swollen area near vaginal entrance, she thinks it is a bartholins gland. It has not gotten any more painful but is tender. Wanted to see if Victorino DikeJennifer would send in abx or if she has to be seen.

## 2019-01-22 NOTE — Telephone Encounter (Signed)
Left message that I will send rx for doxycyline and leave it alone, if not better then make appt.

## 2019-01-22 NOTE — Telephone Encounter (Signed)
Patient called, stated she needs someone to call back.  She's thinks she has a swollen gland or cyst that's in an uncomfortable sport.    [facility]  [phone]

## 2019-01-22 NOTE — Telephone Encounter (Signed)
Rx linzess

## 2019-02-12 ENCOUNTER — Other Ambulatory Visit: Payer: Self-pay | Admitting: Adult Health

## 2019-05-16 ENCOUNTER — Telehealth: Payer: Self-pay | Admitting: Adult Health

## 2019-05-16 NOTE — Telephone Encounter (Signed)
Continue taking OCs

## 2019-05-16 NOTE — Telephone Encounter (Signed)
Pt states that she takes her birth control continuously so that she does not have a menstrual cycle. The last few months she states she has had some break through bleeding and wanting to know if this can be normal.

## 2019-05-16 NOTE — Telephone Encounter (Signed)
Spoke with pt. Pt takes birth control continuous so she don't have a period. Pt is having some break through spotting. It don't get heavy. Lasts off and on x 2 weeks. May have been a little late taking pills, some days it's 8:00 and some days it's 10:00. Has same sex partner. I advised to take pills closer to the same time each day. Advised if it continues, can let us know and we can get her in to be checked. JSY

## 2019-06-09 ENCOUNTER — Other Ambulatory Visit: Payer: Self-pay | Admitting: Adult Health

## 2019-06-11 ENCOUNTER — Telehealth: Payer: Self-pay | Admitting: Adult Health

## 2019-06-11 ENCOUNTER — Telehealth: Payer: Self-pay | Admitting: *Deleted

## 2019-06-11 NOTE — Telephone Encounter (Signed)
Patient call stating that she would like a call back from Denair or a nurse. Pt sis not state the reason for the call. Please contact pt

## 2019-06-11 NOTE — Telephone Encounter (Signed)
Patient states she started a new pill pack last Monday.  She is now having dark brown discharge. She is confused and concerned as to why she is continuing to have break through bleeding. Please call patient at your convenience as she was very hard to follow throughout our conversation. Can call tomorrow if you would like.

## 2019-06-12 NOTE — Telephone Encounter (Signed)
Was taking OCs continuously and had BTB then started taking normal and had BTB, was brown, I would continue taking OCs and it should correct it self, but if not call me back, may need to change OCs

## 2019-06-13 MED ORDER — METRONIDAZOLE 0.75 % VA GEL: 1.0000 | Freq: Every day | VAGINAL | 1 refills | Status: DC

## 2019-06-13 NOTE — Telephone Encounter (Signed)
She thinks its BV, will rx metrogel

## 2019-06-13 NOTE — Telephone Encounter (Signed)
Pt states that the color of the discharge is grey and not brown. She believes she may have BV. Pt states if she can be called after 9:30am.

## 2019-06-13 NOTE — Addendum Note (Signed)
Addended by: Derrek Monaco A on: 06/13/2019 10:50 AM   Modules accepted: Orders

## 2019-06-18 ENCOUNTER — Telehealth: Payer: Self-pay | Admitting: Adult Health

## 2019-06-18 NOTE — Telephone Encounter (Signed)
Patient called stating that she was given a medication last week but she does not think it is working, pt would like to know if she needs to make an appointment to come in or can we change the medication. Please contact pt

## 2019-06-18 NOTE — Telephone Encounter (Signed)
Just finished Metrogel last night, give it about a week if still having discharge will make appt to be seen

## 2019-06-18 NOTE — Telephone Encounter (Signed)
Pt states JAG prescribed Metrogel last week. Pt has used 5 applicators and having a discharge. Now discharge is brownish with bleeding. Pt concerned that she is still having discharge. Finished Metrogel last pm. Does she need to give it more time? Please advise. Thanks!! Prentiss

## 2019-08-24 ENCOUNTER — Other Ambulatory Visit: Payer: Self-pay | Admitting: Adult Health

## 2019-10-02 ENCOUNTER — Telehealth: Payer: Self-pay | Admitting: *Deleted

## 2019-10-02 NOTE — Telephone Encounter (Signed)
Pt left message with phone nurse that she needs someone to call her back to discuss a medication that another provider took her off of.

## 2019-10-03 NOTE — Telephone Encounter (Signed)
Pt went to PCP and was taken off of Lisinopril due to lip swelling. Was told to monitor BP x 2 weeks and he would prescribe something else. Pt wanted to let you know. [place]

## 2019-11-11 ENCOUNTER — Other Ambulatory Visit: Payer: Self-pay | Admitting: Women's Health

## 2019-11-20 ENCOUNTER — Other Ambulatory Visit: Payer: Self-pay | Admitting: Adult Health

## 2019-11-20 ENCOUNTER — Other Ambulatory Visit: Payer: Self-pay | Admitting: Women's Health

## 2019-11-21 ENCOUNTER — Telehealth: Payer: Self-pay | Admitting: *Deleted

## 2019-11-21 NOTE — Telephone Encounter (Signed)
Pt needs a refill on her birth control. Thanks!!

## 2019-11-22 ENCOUNTER — Other Ambulatory Visit: Payer: Self-pay | Admitting: Advanced Practice Midwife

## 2019-11-22 MED ORDER — NORETHIN ACE-ETH ESTRAD-FE 1-20 MG-MCG(24) PO CHEW: CHEWABLE_TABLET | ORAL | 0 refills | Status: DC

## 2019-11-22 NOTE — Telephone Encounter (Signed)
Telephoned patient at home number and advised patient would need to schedule an appointment before a prescription could be called in for BCP. Patient will schedule appointment.Marland Kitchen

## 2020-01-22 ENCOUNTER — Telehealth: Payer: Self-pay | Admitting: Adult Health

## 2020-01-22 NOTE — Telephone Encounter (Signed)

## 2020-01-24 ENCOUNTER — Other Ambulatory Visit (HOSPITAL_COMMUNITY)
Admission: RE | Admit: 2020-01-24 | Discharge: 2020-01-24 | Disposition: A | Discharge status: Home / Self Care | Payer: BC Managed Care – PPO | Source: Ambulatory Visit | Attending: Adult Health | Admitting: Adult Health

## 2020-01-24 ENCOUNTER — Other Ambulatory Visit: Payer: Self-pay

## 2020-01-24 ENCOUNTER — Ambulatory Visit (INDEPENDENT_AMBULATORY_CARE_PROVIDER_SITE_OTHER): Payer: BC Managed Care – PPO | Admitting: Adult Health

## 2020-01-24 ENCOUNTER — Encounter: Payer: Self-pay | Admitting: Adult Health

## 2020-01-24 VITALS — BP 141/89 | HR 113 | Ht 64.5 in | Wt 160.0 lb

## 2020-01-24 DIAGNOSIS — Z01419 Encounter for gynecological examination (general) (routine) without abnormal findings: Secondary | ICD-10-CM

## 2020-01-24 DIAGNOSIS — Z3041 Encounter for surveillance of contraceptive pills: Secondary | ICD-10-CM

## 2020-01-24 DIAGNOSIS — N898 Other specified noninflammatory disorders of vagina: Secondary | ICD-10-CM

## 2020-01-24 DIAGNOSIS — B379 Candidiasis, unspecified: Secondary | ICD-10-CM

## 2020-01-24 LAB — POCT WET PREP (WET MOUNT): WBC, Wet Prep HPF POC: POSITIVE

## 2020-01-24 MED ORDER — NORETHIN ACE-ETH ESTRAD-FE 1-20 MG-MCG(24) PO CHEW: CHEWABLE_TABLET | ORAL | 4 refills | Status: DC

## 2020-01-24 MED ORDER — FLUCONAZOLE 150 MG PO TABS: ORAL_TABLET | ORAL | 1 refills | Status: DC

## 2020-01-24 NOTE — Progress Notes (Signed)
Patient ID: Sharon Bush, female   DOB: 01/03/1981, 39 y.o.   MRN: [id number] History of Present Illness: Sharon Bush is a 39 year old white female, married, G0P0, in for a well woman gyn exam and pap. PCP is Dr Janna Arch.    Current Medications, Allergies, Past Medical History, Past Surgical History, Family History and Social History were reviewed in [facility] record.     Review of Systems: Patient denies any headaches, hearing loss, fatigue, blurred vision, shortness of breath, chest pain, abdominal pain, problems with bowel movements, urination, or intercourse. No joint pain or mood swings. +vaginal discharge and itching, ?yeast     Physical Exam:BP (!) 141/89 (BP Location: Left Arm, Patient Position: Sitting, Cuff Size: Normal)   Pulse (!) 113   Ht 5' 4.5" (1.638 m)   Wt 160 lb (72.6 kg)   LMP 01/14/2020 (Approximate)   BMI 27.04 kg/m  General:  Well developed, well nourished, no acute distress Skin:  Warm and dry Neck:  Midline trachea, normal thyroid, good ROM, no lymphadenopathy Lungs; Clear to auscultation bilaterally Breast:  No dominant palpable mass, retraction, or nipple discharge, has bilateral implants Cardiovascular: Regular rate and rhythm Abdomen:  Soft, non tender, no hepatosplenomegaly Pelvic:  External genitalia is normal in appearance, no lesions.  The vagina is normal in appearance,except has clumpy discharge without odor.Marland Kitchen Urethra has no lesions or masses. The cervix is smooth, pap with high risk HPV 16/18 genotyping performed .  Uterus is felt to be normal size, shape, and contour.  No adnexal masses or tenderness noted.Bladder is non tender, no masses felt, wet prep: +WBC and yeast  Extremities/musculoskeletal:  No swelling or varicosities noted, no clubbing or cyanosis Psych:  No mood changes, alert and cooperative,seems happy Fall risk is low PHQ 2 score is 0 Examination chaperoned by Malachy Mood LPN  Impression and Plan: 1.  Encounter for gynecological examination with Papanicolaou smear of cervix Pap sent Pap in 3 years if normal Physical in 1 year Labs with PCP Get mammogram at 39   2. Encounter for surveillance of contraceptive pills Meds ordered this encounter  Medications  . fluconazole (DIFLUCAN) 150 MG tablet    Sig: Take 1 now and repeat 1 in 3 days    Dispense:  2 tablet    Refill:  1    Order Specific Question:   Supervising Provider    Answer:   EURE, LUTHER H [[id number]]  . Norethin Ace-Eth Estrad-FE 1-20 MG-MCG(24) CHEW    Sig: TAKE 1 TABLET BY MOUTH EVERY DAY    Dispense:  84 tablet    Refill:  4    Order Specific Question:   Supervising Provider    Answer:   Despina Hidden, LUTHER H [[id number]]    3. Yeast infection Will rx diflucan   4. Vaginal discharge

## 2020-01-28 LAB — CYTOLOGY - PAP
Adequacy: ABSENT
Comment: NEGATIVE
Diagnosis: NEGATIVE
High risk HPV: NEGATIVE

## 2020-01-29 ENCOUNTER — Telehealth: Payer: Self-pay | Admitting: *Deleted

## 2020-01-29 NOTE — Telephone Encounter (Signed)
Pt aware pap was negative for malignancy and HPV. Did show yeast and Diflucan should take care of that. Pt voiced understanding. JSY

## 2020-01-29 NOTE — Telephone Encounter (Signed)
-----   Message from Adline Potter, NP sent at 01/29/2020  8:05 AM EST ----- Marylu Lund can you let her know negative for malignancy and HPV, had yeast which has diflucan for already

## 2020-02-06 ENCOUNTER — Other Ambulatory Visit: Payer: Self-pay | Admitting: Advanced Practice Midwife

## 2020-02-24 ENCOUNTER — Ambulatory Visit: Payer: BC Managed Care – PPO

## 2020-03-23 ENCOUNTER — Other Ambulatory Visit: Payer: Self-pay | Admitting: Adult Health

## 2020-08-15 ENCOUNTER — Other Ambulatory Visit: Payer: Self-pay | Admitting: Adult Health

## 2020-09-29 ENCOUNTER — Other Ambulatory Visit (HOSPITAL_COMMUNITY): Payer: Self-pay | Admitting: Family Medicine

## 2020-09-29 ENCOUNTER — Ambulatory Visit (HOSPITAL_COMMUNITY)
Admission: RE | Admit: 2020-09-29 | Discharge: 2020-09-29 | Disposition: A | Discharge status: Home / Self Care | Payer: BC Managed Care – PPO | Source: Ambulatory Visit | Attending: Family Medicine | Admitting: Family Medicine

## 2020-09-29 ENCOUNTER — Other Ambulatory Visit: Payer: Self-pay

## 2020-09-29 DIAGNOSIS — M25572 Pain in left ankle and joints of left foot: Secondary | ICD-10-CM | POA: Insufficient documentation

## 2020-09-29 DIAGNOSIS — M79672 Pain in left foot: Secondary | ICD-10-CM | POA: Insufficient documentation

## 2020-09-29 DIAGNOSIS — M25571 Pain in right ankle and joints of right foot: Secondary | ICD-10-CM

## 2020-12-31 ENCOUNTER — Other Ambulatory Visit (HOSPITAL_COMMUNITY): Payer: Self-pay | Admitting: Adult Health

## 2020-12-31 DIAGNOSIS — Z1231 Encounter for screening mammogram for malignant neoplasm of breast: Secondary | ICD-10-CM

## 2021-01-03 ENCOUNTER — Other Ambulatory Visit: Payer: Self-pay | Admitting: Adult Health

## 2021-01-08 ENCOUNTER — Ambulatory Visit (HOSPITAL_COMMUNITY)
Admission: RE | Admit: 2021-01-08 | Discharge: 2021-01-08 | Disposition: A | Discharge status: Home / Self Care | Payer: BC Managed Care – PPO | Source: Ambulatory Visit | Attending: Adult Health | Admitting: Adult Health

## 2021-01-08 ENCOUNTER — Encounter (HOSPITAL_COMMUNITY): Payer: Self-pay

## 2021-01-08 DIAGNOSIS — Z1231 Encounter for screening mammogram for malignant neoplasm of breast: Secondary | ICD-10-CM | POA: Insufficient documentation

## 2021-03-27 ENCOUNTER — Telehealth: Payer: Self-pay | Admitting: Adult Health

## 2021-03-27 MED ORDER — FLUCONAZOLE 150 MG PO TABS: ORAL_TABLET | ORAL | 1 refills | Status: DC

## 2021-03-27 NOTE — Telephone Encounter (Signed)
Patient called stating that she has a yeast infection and she would like for Victorino Dike to call her something in to her pharmacy. Please contact pt when sent

## 2021-03-27 NOTE — Addendum Note (Signed)
Addended by: Cyril Mourning A on: 03/27/2021 01:48 PM   Modules accepted: Orders

## 2021-03-27 NOTE — Telephone Encounter (Signed)
Pt aware that refill sent on diflucan

## 2021-04-03 ENCOUNTER — Other Ambulatory Visit: Payer: Self-pay | Admitting: Adult Health

## 2021-04-10 ENCOUNTER — Other Ambulatory Visit: Payer: BC Managed Care – PPO | Admitting: Adult Health

## 2021-05-11 ENCOUNTER — Encounter: Payer: Self-pay | Admitting: Advanced Practice Midwife

## 2021-05-11 ENCOUNTER — Other Ambulatory Visit: Payer: Self-pay

## 2021-05-11 ENCOUNTER — Ambulatory Visit (INDEPENDENT_AMBULATORY_CARE_PROVIDER_SITE_OTHER): Payer: BC Managed Care – PPO | Admitting: Advanced Practice Midwife

## 2021-05-11 VITALS — BP 134/90 | HR 103 | Ht 65.5 in | Wt 160.0 lb

## 2021-05-11 DIAGNOSIS — Z01419 Encounter for gynecological examination (general) (routine) without abnormal findings: Secondary | ICD-10-CM

## 2021-05-11 MED ORDER — CYCLOBENZAPRINE HCL 10 MG PO TABS: 10.0000 mg | ORAL_TABLET | Freq: Three times a day (TID) | ORAL | 1 refills | Status: DC | PRN

## 2021-05-11 NOTE — Progress Notes (Signed)
Sharon Bush 40 y.o.  Vitals:   05/11/21 1436  BP: 134/90  Pulse: (!) 103     Filed Weights   05/11/21 1436  Weight: 160 lb (72.6 kg)    Past Medical History: Past Medical History:  Diagnosis Date  . Abnormal Pap smear   . ADD (attention deficit disorder)   . BV (bacterial vaginosis) 05/20/2016  . Constipation 08/19/2014  . Contraceptive management 08/08/2013  . Hemorrhoids 01/22/2014  . History of abnormal Pap smear 08/08/2013  . IBS (irritable bowel syndrome)   . IUD (intrauterine device) in place 01/22/2014  . Migraines   . Pseudomonas aeruginosa infection   . Psoriasis   . Swelling of labia 06/28/2016  . Vaginal discharge 01/07/2015  . Vaginal Pap smear, abnormal   . Yeast infection 03/18/2015    Past Surgical History: Past Surgical History:  Procedure Laterality Date  . AUGMENTATION MAMMAPLASTY    . BREAST SURGERY     augmentation  . FOOT SURGERY      Family History: Family History  Problem Relation Age of Onset  . Hypertension Mother   . Hypertension Maternal Grandmother   . Diabetes Maternal Grandfather     Social History: Social History   Tobacco Use  . Smoking status: Former Smoker    Types: Cigarettes  . Smokeless tobacco: Never Used  Vaping Use  . Vaping Use: Never used  Substance Use Topics  . Alcohol use: Yes  . Drug use: No    Allergies:  Allergies  Allergen Reactions  . Penicillins Hives and Shortness Of Breath  . Sulfa Antibiotics Hives and Shortness Of Breath  . Lisinopril Swelling    Lips swelling      Current Outpatient Medications:  .  Adalimumab 40 MG/0.8ML PNKT, Inject into the skin. Every other Saturday, Disp: , Rfl:  .  amphetamine-dextroamphetamine (ADDERALL) 15 MG tablet, Take 15 mg by mouth 3 (three) times daily., Disp: , Rfl:  .  CHARLOTTE 24 FE 1-20 MG-MCG(24) CHEW, TAKE 1 TABLET BY MOUTH EVERY DAY, Disp: 84 tablet, Rfl: 4 .  LINZESS 145 MCG CAPS capsule, TAKE 1 CAPSULE BY MOUTH EVERY DAY, Disp: 30 capsule,  Rfl: 3  History of Present Illness: Here for well woman exam. Pap 2/21 normal w/neg HPV On COCs for contraception, husband may get vasectomy at some point. .  Mammogram 01/08/21 normal.    Review of Systems   Patient denies any headaches, blurred vision, shortness of breath, chest pain, abdominal pain, problems with bowel movements, urination, or intercourse. 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 Flexeril has helped in the past.  Worse w/period  Has tried skipping placebos, did well for a few months then bled for a month. `     Physical Exam: General:  Well developed, well nourished, no acute distress Skin:  Warm and dry Neck:  Midline trachea, normal thyroid Lungs; Clear to auscultation bilaterally Breast:  No dominant palpable mass, retraction, or nipple discharge.  Implants present Cardiovascular: Regular rate and rhythm Abdomen:  Soft, non tender, no hepatosplenomegaly Pelvic:  External genitalia is normal in appearance.  The vagina is normal in appearance.  The cervix is nulliparous.  Uterus is felt to be normal size, shape, and contour.  No adnexal masses or tenderness noted.  Extremities:  No swelling or varicosities noted Psych:  No mood changes.     Impression: normal GYN exam Tension HA, worse w/period:  rx flexeril (prefers over Robaxin), try skipping placebos q97month, gradually  increasing amount of time b/t active pills   Plan: Pap in 2024, yearly mammograms.  Labs per PCP Dondiego.

## 2021-05-18 ENCOUNTER — Other Ambulatory Visit: Payer: Self-pay | Admitting: Adult Health

## 2021-10-15 ENCOUNTER — Other Ambulatory Visit: Payer: Self-pay | Admitting: Adult Health

## 2021-11-27 ENCOUNTER — Encounter: Payer: Self-pay | Admitting: Advanced Practice Midwife

## 2021-11-30 ENCOUNTER — Other Ambulatory Visit: Payer: Self-pay | Admitting: Adult Health

## 2021-11-30 MED ORDER — FLUCONAZOLE 150 MG PO TABS
ORAL_TABLET | ORAL | 1 refills | Status: DC
Start: 2021-11-30 — End: 2022-12-15

## 2021-11-30 NOTE — Progress Notes (Signed)
Will rx diflucan  

## 2021-12-07 ENCOUNTER — Other Ambulatory Visit (HOSPITAL_COMMUNITY): Payer: Self-pay

## 2021-12-07 ENCOUNTER — Other Ambulatory Visit (HOSPITAL_COMMUNITY): Payer: Self-pay | Admitting: Adult Health

## 2021-12-07 DIAGNOSIS — Z1231 Encounter for screening mammogram for malignant neoplasm of breast: Secondary | ICD-10-CM

## 2021-12-23 ENCOUNTER — Encounter (HOSPITAL_COMMUNITY): Payer: BC Managed Care – PPO

## 2021-12-23 DIAGNOSIS — Z1231 Encounter for screening mammogram for malignant neoplasm of breast: Secondary | ICD-10-CM

## 2022-01-11 ENCOUNTER — Other Ambulatory Visit (HOSPITAL_COMMUNITY): Payer: Self-pay | Admitting: Advanced Practice Midwife

## 2022-01-11 DIAGNOSIS — Z1231 Encounter for screening mammogram for malignant neoplasm of breast: Secondary | ICD-10-CM

## 2022-01-13 ENCOUNTER — Other Ambulatory Visit (HOSPITAL_COMMUNITY): Payer: Self-pay | Admitting: Advanced Practice Midwife

## 2022-01-13 ENCOUNTER — Other Ambulatory Visit: Payer: Self-pay

## 2022-01-13 ENCOUNTER — Ambulatory Visit (HOSPITAL_COMMUNITY)
Admission: RE | Admit: 2022-01-13 | Discharge: 2022-01-13 | Disposition: A | Discharge status: Home / Self Care | Payer: BC Managed Care – PPO | Source: Ambulatory Visit | Attending: Advanced Practice Midwife | Admitting: Advanced Practice Midwife

## 2022-01-13 DIAGNOSIS — Z1231 Encounter for screening mammogram for malignant neoplasm of breast: Secondary | ICD-10-CM | POA: Insufficient documentation

## 2022-02-08 ENCOUNTER — Other Ambulatory Visit: Payer: Self-pay | Admitting: Adult Health

## 2022-04-08 ENCOUNTER — Other Ambulatory Visit: Payer: Self-pay | Admitting: Adult Health

## 2022-05-03 ENCOUNTER — Ambulatory Visit (INDEPENDENT_AMBULATORY_CARE_PROVIDER_SITE_OTHER): Payer: BC Managed Care – PPO | Admitting: Adult Health

## 2022-05-03 ENCOUNTER — Encounter: Payer: Self-pay | Admitting: Adult Health

## 2022-05-03 VITALS — BP 115/80 | HR 84 | Ht 65.5 in | Wt 162.0 lb

## 2022-05-03 DIAGNOSIS — Z1211 Encounter for screening for malignant neoplasm of colon: Secondary | ICD-10-CM

## 2022-05-03 DIAGNOSIS — Z3041 Encounter for surveillance of contraceptive pills: Secondary | ICD-10-CM | POA: Diagnosis not present

## 2022-05-03 DIAGNOSIS — Z01419 Encounter for gynecological examination (general) (routine) without abnormal findings: Secondary | ICD-10-CM | POA: Diagnosis not present

## 2022-05-03 LAB — HEMOCCULT GUIAC POC 1CARD (OFFICE): Fecal Occult Blood, POC: NEGATIVE

## 2022-05-03 MED ORDER — NORETHIN ACE-ETH ESTRAD-FE 1-20 MG-MCG(24) PO CHEW: 1.0000 | CHEWABLE_TABLET | Freq: Every day | ORAL | 4 refills | Status: DC

## 2022-05-03 NOTE — Progress Notes (Signed)
Patient ID: Sharon Bush, female   DOB: July 15, 1981, 41 y.o.   MRN: [id number] ?History of Present Illness: ? ?Sharon Bush is a 41 year old white female, married, G0P0, in for a well woman gyn exam. ?She started period today. ?She has job promotion,in school system. ?Lab Results  ?Component Value Date  ? DIAGPAP  01/24/2020  ?  - Negative for intraepithelial lesion or malignancy (NILM)  ? HPV NOT DETECTED 07/21/2017  ? HPVHIGH Negative 01/24/2020  ?  ? ?Current Medications, Allergies, Past Medical History, Past Surgical History, Family History and Social History were reviewed in [facility] record.   ? ? ?Review of Systems: ?Patient denies any headaches, hearing loss, fatigue, blurred vision, shortness of breath, chest pain, abdominal pain, problems with bowel movements, urination, or intercourse. No joint pain or mood swings.  ?Has swelling in vagina area at times if working out. ? ? ?Physical Exam:BP 115/80 (BP Location: Left Arm, Patient Position: Sitting, Cuff Size: Normal)   Pulse 84   Ht 5' 5.5" (1.664 m)   Wt 162 lb (73.5 kg)   LMP 05/03/2022 (Exact Date)   BMI 26.55 kg/m?   ?General:  Well developed, well nourished, no acute distress ?Skin:  Warm and dry ?Neck:  Midline trachea, normal thyroid, good ROM, no lymphadenopathy ?Lungs; Clear to auscultation bilaterally ?Breast:  No dominant palpable mass, retraction, or nipple discharge ?Cardiovascular: Regular rate and rhythm ?Abdomen:  Soft, non tender, no hepatosplenomegaly ?Pelvic:  External genitalia is normal in appearance, no lesions.  The vagina is normal in appearance,brwon blood. Urethra has no lesions or masses. The cervix is smooth.  Uterus is felt to be normal size, shape, and contour.  No adnexal masses or tenderness noted.Bladder is non tender, no masses felt. ?Rectal: Good sphincter tone, no polyps, or hemorrhoids felt.  Hemoccult negative. ?Extremities/musculoskeletal:  No swelling or varicosities noted, no clubbing or  cyanosis ?Psych:  No mood changes, alert and cooperative,seems happy ?AA is 1 ?Fall risk is low ? ?  05/03/2022  ?  1:44 PM 05/11/2021  ?  2:42 PM 01/24/2020  ?  2:07 PM  ?Depression screen PHQ 2/9  ?Decreased Interest 0 0 0  ?Down, Depressed, Hopeless 0 0 0  ?PHQ - 2 Score 0 0 0  ?Altered sleeping 1 0   ?Tired, decreased energy 1 0   ?Change in appetite 1 1   ?Feeling bad or failure about yourself  0 0   ?Trouble concentrating 0 0   ?Moving slowly or fidgety/restless 0 0   ?Suicidal thoughts 0 0   ?PHQ-9 Score 3 1   ?  ? ?  05/03/2022  ?  1:44 PM 05/11/2021  ?  2:43 PM  ?GAD 7 : Generalized Anxiety Score  ?Nervous, Anxious, on Edge 1 0  ?Control/stop worrying 0 0  ?Worry too much - different things 0 1  ?Trouble relaxing 1 0  ?Restless 1 0  ?Easily annoyed or irritable 1 1  ?Afraid - awful might happen 0 0  ?Total GAD 7 Score 4 2  ? ? Upstream - 05/03/22 1344   ? ?  ? Pregnancy Intention Screening  ? Does the patient want to become pregnant in the next year? No   ? Does the patient's partner want to become pregnant in the next year? No   ? Would the patient like to discuss contraceptive options today? No   ?  ? Contraception Wrap Up  ? Current Method Oral Contraceptive   ? End  Method Oral Contraceptive   ? Contraception Counseling Provided No   ? ?  ?  ? ?  ?  ? Examination chaperoned by Clint Bolder RN ? ? ?Impression and Plan: ?1. Encounter for well woman exam with routine gynecological exam ?Pap and physical in 1 year ?Labs with PCP ?Mammogram yearly  ? ?2. Encounter for screening fecal occult blood testing ?Hemoccult negative  ? ?3. Encounter for surveillance of contraceptive pills ?Meds ordered this encounter  ?Medications  ? Norethin Ace-Eth Estrad-FE (MIBELAS 24 FE) 1-20 MG-MCG(24) CHEW  ?  Sig: Chew 1 tablet by mouth daily.  ?  Dispense:  84 tablet  ?  Refill:  4  ?  Order Specific Question:   Supervising Provider  ?  Answer:   Duane Lope H [[id number]]  ?  ? ? ? ?  ?  ?

## 2022-07-19 ENCOUNTER — Other Ambulatory Visit: Payer: Self-pay | Admitting: Adult Health

## 2022-08-03 ENCOUNTER — Other Ambulatory Visit: Payer: Self-pay | Admitting: Adult Health

## 2022-08-10 ENCOUNTER — Other Ambulatory Visit: Payer: Self-pay

## 2022-08-10 ENCOUNTER — Ambulatory Visit
Admission: EM | Admit: 2022-08-10 | Discharge: 2022-08-10 | Disposition: A | Discharge status: Home / Self Care | Payer: BC Managed Care – PPO | Attending: Nurse Practitioner | Admitting: Nurse Practitioner

## 2022-08-10 ENCOUNTER — Encounter: Payer: Self-pay | Admitting: Emergency Medicine

## 2022-08-10 DIAGNOSIS — R220 Localized swelling, mass and lump, head: Secondary | ICD-10-CM

## 2022-08-10 DIAGNOSIS — L500 Allergic urticaria: Secondary | ICD-10-CM

## 2022-08-10 MED ORDER — FAMOTIDINE 20 MG PO TABS
20.0000 mg | ORAL_TABLET | Freq: Once | ORAL | Status: AC
  Administered 2022-08-10: 20 mg via ORAL

## 2022-08-10 MED ORDER — METHYLPREDNISOLONE SODIUM SUCC 125 MG IJ SOLR
125.0000 mg | Freq: Once | INTRAMUSCULAR | Status: AC
  Administered 2022-08-10: 125 mg via INTRAMUSCULAR

## 2022-08-10 MED ORDER — DIPHENHYDRAMINE HCL 25 MG PO CAPS
25.0000 mg | ORAL_CAPSULE | Freq: Once | ORAL | Status: AC
  Administered 2022-08-10: 25 mg via ORAL

## 2022-08-10 MED ORDER — FAMOTIDINE 20 MG PO TABS: 20.0000 mg | ORAL_TABLET | Freq: Two times a day (BID) | ORAL | 0 refills | Status: DC

## 2022-08-10 MED ORDER — CETIRIZINE HCL 10 MG PO TABS: 10.0000 mg | ORAL_TABLET | Freq: Two times a day (BID) | ORAL | 0 refills | Status: DC

## 2022-08-10 NOTE — Discharge Instructions (Addendum)
-   Start cetirizine 10 mg twice daily, famotidine 20 mg twice daily - You can use Benadryl 25-50 mg at night time as needed for itching - We have given you a steroid shot today to help with inflammation - Stop Benicar and follow up with primary care provider  - Go to ER if symptoms persist or worsen

## 2022-08-10 NOTE — ED Provider Notes (Signed)
[facility]    CSN: [id number] Arrival date & time: 08/10/22  1623      History   Chief Complaint Chief Complaint  Patient presents with   Allergic Reaction    HPI Sharon Bush is a 41 y.o. female.   Patient presents for hives that began last night.  Reports the rash started behind her left ear.  Now, is on her scalp, behind her head, on bilateral arms and legs in between thighs.  Also endorses lip swelling that started this afternoon.  Denies use of new skin care products, medications, supplements, detergents, body care products.  Also denies fever, nausea/vomiting, foaming at the mouth, shortness of breath, throat or tongue swelling, and new muscle pain or joint aches.    She has taken Benadryl for the symptoms which helped temporarily.  Patient's mother reports that she had angioedema reaction to Benicar.  Patient is currently taking Benicar.  Has a history of lip swelling with lisinopril.    Past Medical History:  Diagnosis Date   Abnormal Pap smear    ADD (attention deficit disorder)    BV (bacterial vaginosis) 05/20/2016   Constipation 08/19/2014   Contraceptive management 08/08/2013   Hemorrhoids 01/22/2014   History of abnormal Pap smear 08/08/2013   IBS (irritable bowel syndrome)    IUD (intrauterine device) in place 01/22/2014   Migraines    Pseudomonas aeruginosa infection    Psoriasis    Swelling of labia 06/28/2016   Vaginal discharge 01/07/2015   Vaginal Pap smear, abnormal    Yeast infection 03/18/2015    Patient Active Problem List   Diagnosis Date Noted   Encounter for screening fecal occult blood testing 05/03/2022   Encounter for well woman exam with routine gynecological exam 05/03/2022   Essential hypertension 07/31/2018   Swelling of labia 06/28/2016   BV (bacterial vaginosis) 05/20/2016   Yeast infection 03/18/2015   Vaginal discharge 01/07/2015   Constipation 08/19/2014   Hemorrhoids 01/22/2014   IBS (irritable bowel syndrome)  01/22/2014   IBS (irritable bowel syndrome) 08/08/2013   Migraines 08/08/2013   Contraceptive management 08/08/2013   History of abnormal Pap smear 08/08/2013    Past Surgical History:  Procedure Laterality Date   AUGMENTATION MAMMAPLASTY     BREAST SURGERY     augmentation   FOOT SURGERY      OB History     Gravida  0   Para  0   Term  0   Preterm  0   AB  0   Living  0      SAB  0   IAB  0   Ectopic  0   Multiple  0   Live Births               Home Medications    Prior to Admission medications   Medication Sig Start Date End Date Taking? Authorizing Provider  cetirizine (ZYRTEC) 10 MG tablet Take 1 tablet (10 mg total) by mouth 2 (two) times daily for 7 days. 08/10/22 08/17/22 Yes Valentino Nose, NP  famotidine (PEPCID) 20 MG tablet Take 1 tablet (20 mg total) by mouth 2 (two) times daily for 7 days. 08/10/22 08/17/22 Yes Valentino Nose, NP  Adalimumab 40 MG/0.8ML PNKT Inject into the skin. Every other Saturday    [provider]  amphetamine-dextroamphetamine (ADDERALL) 15 MG tablet Take 15 mg by mouth 3 (three) times daily.    [provider]  cyclobenzaprine (FLEXERIL) 10 MG tablet TAKE  ONE TABLET BY MOUTH EVERY 8 HOURS AS NEEDED 08/03/22   Estill Dooms, NP  fluconazole (DIFLUCAN) 150 MG tablet Take 1 now and 1 in 3 days 11/30/21   Estill Dooms, NP  LINZESS 145 MCG CAPS capsule TAKE ONE CAPSULE BY MOUTH EVERY DAY 07/19/22   Estill Dooms, NP  Norethin Ace-Eth Estrad-FE (MIBELAS 24 FE) 1-20 MG-MCG(24) CHEW Chew 1 tablet by mouth daily. 05/03/22   Estill Dooms, NP    Family History Family History  Problem Relation Age of Onset   Hypertension Mother    Hypertension Maternal Grandmother    Diabetes Maternal Grandfather     Social History Social History   Tobacco Use   Smoking status: Former    Types: Cigarettes   Smokeless tobacco: Never  Vaping Use   Vaping Use: Never used  Substance Use  Topics   Alcohol use: Yes   Drug use: No     Allergies   Penicillins, Sulfa antibiotics, and Lisinopril   Review of Systems Review of Systems Per HPI  Physical Exam Triage Vital Signs ED Triage Vitals  Enc Vitals Group     BP 08/10/22 1628 (!) 152/105     Pulse Rate 08/10/22 1628 (!) 112     Resp 08/10/22 1628 18     Temp 08/10/22 1628 98.1 F (36.7 C)     Temp Source 08/10/22 1628 Oral     SpO2 08/10/22 1628 99 %     Weight --      Height --      Head Circumference --      Peak Flow --      Pain Score 08/10/22 1629 0     Pain Loc --      Pain Edu? --      Excl. in [place]? --    No data found.  Updated Vital Signs BP (!) 131/96 (BP Location: Right Arm)   Pulse 84   Temp 98.1 F (36.7 C) (Oral)   Resp 20   LMP 07/27/2022 (Approximate)   SpO2 94%   Visual Acuity Right Eye Distance:   Left Eye Distance:   Bilateral Distance:    Right Eye Near:   Left Eye Near:    Bilateral Near:     Physical Exam Vitals reviewed.  Constitutional:      General: She is not in acute distress.    Appearance: Normal appearance. She is not toxic-appearing.  HENT:     Head: Normocephalic and atraumatic.     Mouth/Throat:     Mouth: Mucous membranes are moist.     Pharynx: Oropharynx is clear.  Cardiovascular:     Rate and Rhythm: Tachycardia present.  Pulmonary:     Effort: Pulmonary effort is normal. No respiratory distress.     Breath sounds: Normal breath sounds. No wheezing, rhonchi or rales.  Skin:    General: Skin is warm and dry.     Capillary Refill: Capillary refill takes less than 2 seconds.     Findings: Erythema and rash present. Rash is urticarial.       Neurological:     Mental Status: She is alert and oriented to person, place, and time.  Psychiatric:        Behavior: Behavior is cooperative.      UC Treatments / Results  Labs (all labs ordered are listed, but only abnormal results are displayed) Labs Reviewed - No data to  display  EKG   Radiology No results  found.  Procedures Procedures (including critical care time)  Medications Ordered in UC Medications  methylPREDNISolone sodium succinate (SOLU-MEDROL) 125 mg/2 mL injection 125 mg (125 mg Intramuscular Given 08/10/22 1646)  famotidine (PEPCID) tablet 20 mg (20 mg Oral Given 08/10/22 1646)  diphenhydrAMINE (BENADRYL) capsule 25 mg (25 mg Oral Given 08/10/22 1647)    Initial Impression / Assessment and Plan / UC Course  I have reviewed the triage vital signs and the nursing notes.  Pertinent labs & imaging results that were available during my care of the patient were reviewed by me and considered in my medical decision making (see chart for details).    Patient is a very pleasant, uncomfortable appearing 41 year old female presenting for rash, hives, and lip swelling that began last night.  Initially in triage, she is slightly hypertensive and tachycardic, however oxygenating well on room air and not tachypneic.  Solu-Medrol 125 mg IM given along with Pepcid and Benadryl orally.  Patient reported improvement of symptoms with these medications.  Blood pressure stabilized after medication and patient no longer tachycardic after medication.  I encouraged her to stop the Benicar as her mother had angioedema with this medication.  Start twice daily oral antihistamine such as cetirizine along with famotidine 20 mg twice daily.  Continue Benadryl at nighttime as needed for itching.  ER precautions discussed.  Follow-up with PCP regarding blood pressure treatment.  The patient was given the opportunity to ask questions.  All questions answered to their satisfaction.  The patient is in agreement to this plan.  Final Clinical Impressions(s) / UC Diagnoses   Final diagnoses:  Allergic urticaria  Lip swelling     Discharge Instructions      - Start cetirizine 10 mg twice daily, famotidine 20 mg twice daily - You can use Benadryl 25-50 mg at night time as  needed for itching - We have given you a steroid shot today to help with inflammation - Stop Benicar and follow up with primary care provider  - Go to ER if symptoms persist or worsen     ED Prescriptions     Medication Sig Dispense Auth. Provider   cetirizine (ZYRTEC) 10 MG tablet Take 1 tablet (10 mg total) by mouth 2 (two) times daily for 7 days. 14 tablet Cathlean Marseilles A, NP   famotidine (PEPCID) 20 MG tablet Take 1 tablet (20 mg total) by mouth 2 (two) times daily for 7 days. 14 tablet Valentino Nose, NP      PDMP not reviewed this encounter.   Valentino Nose, NP 08/10/22 1745

## 2022-08-10 NOTE — ED Triage Notes (Addendum)
Pt reports itching behind left ear,generalized hives, upper lip swelling since last night.  Pt denies any new self-care products/medications. Airway patent.

## 2022-10-08 IMAGING — MG DIGITAL SCREENING BREAST BILAT IMPLANT W/ TOMO W/ CAD
9 of 12 series · 9 of 28 positions shown · non-contrast
Comparison: Previous exam(s).

CLINICAL DATA: Screening.

EXAM:
DIGITAL SCREENING BILATERAL MAMMOGRAM WITH IMPLANTS, CAD AND
TOMOSYNTHESIS
TECHNIQUE: Bilateral screening digital craniocaudal and mediolateral oblique
mammograms were obtained. Bilateral screening digital breast
tomosynthesis was performed. The images were evaluated with
computer-aided detection. Standard and/or implant displaced views
were performed.

[R CC (1 of 2)]
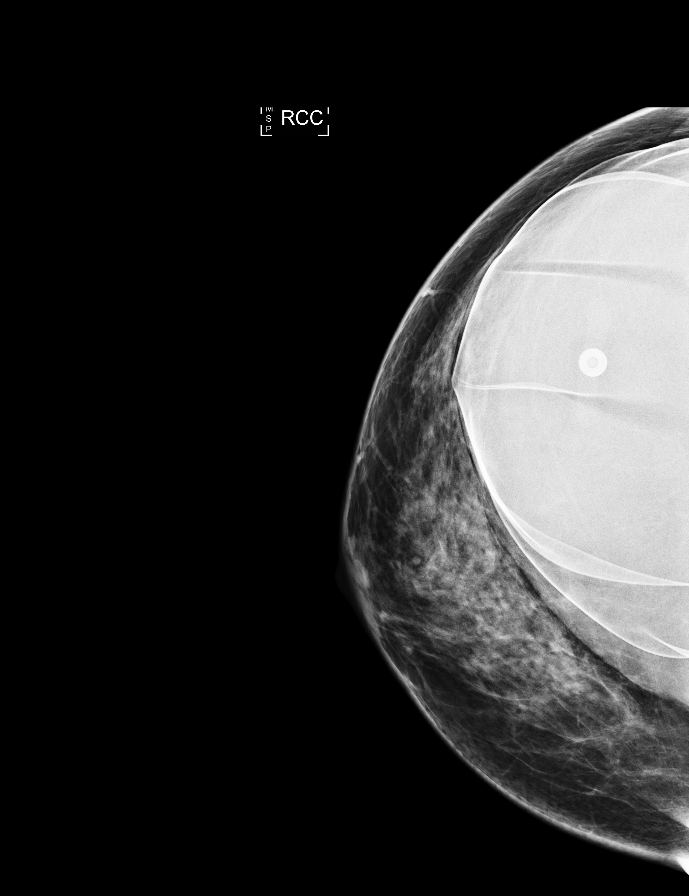

[L MLO (1 of 2)]
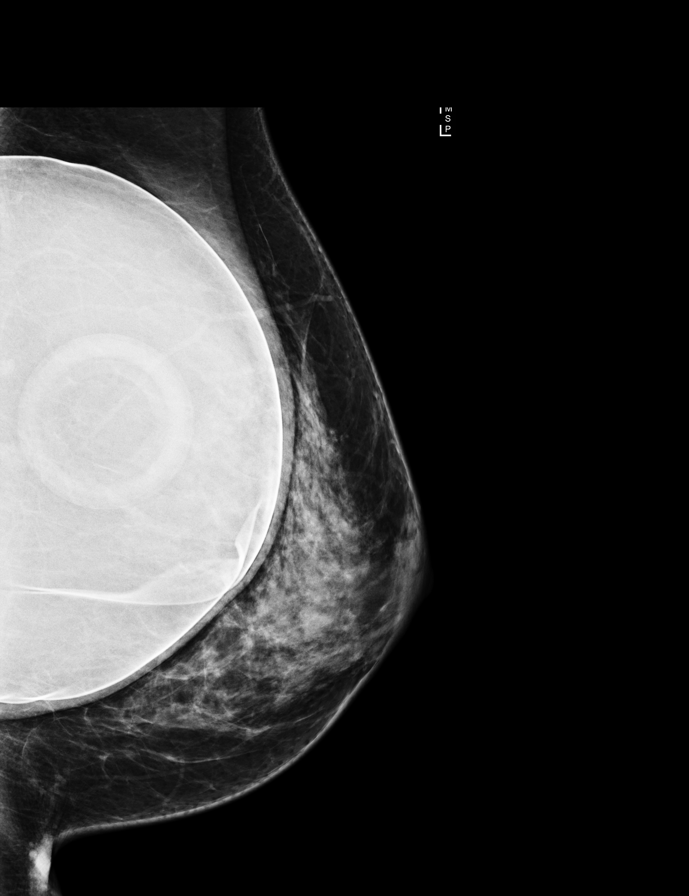

[L CC (1 of 2)]
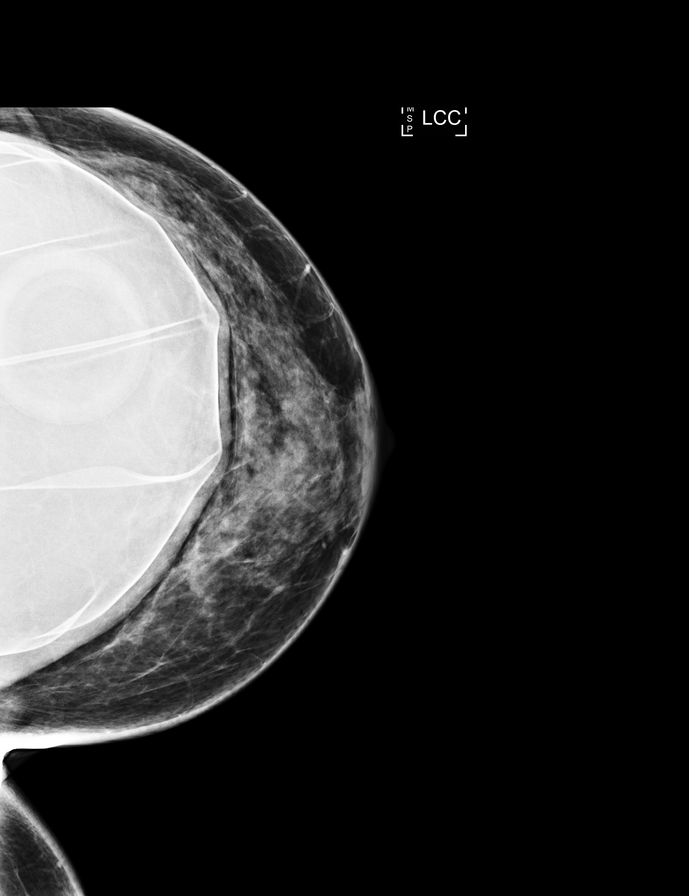

[R MLO (1 of 2)]
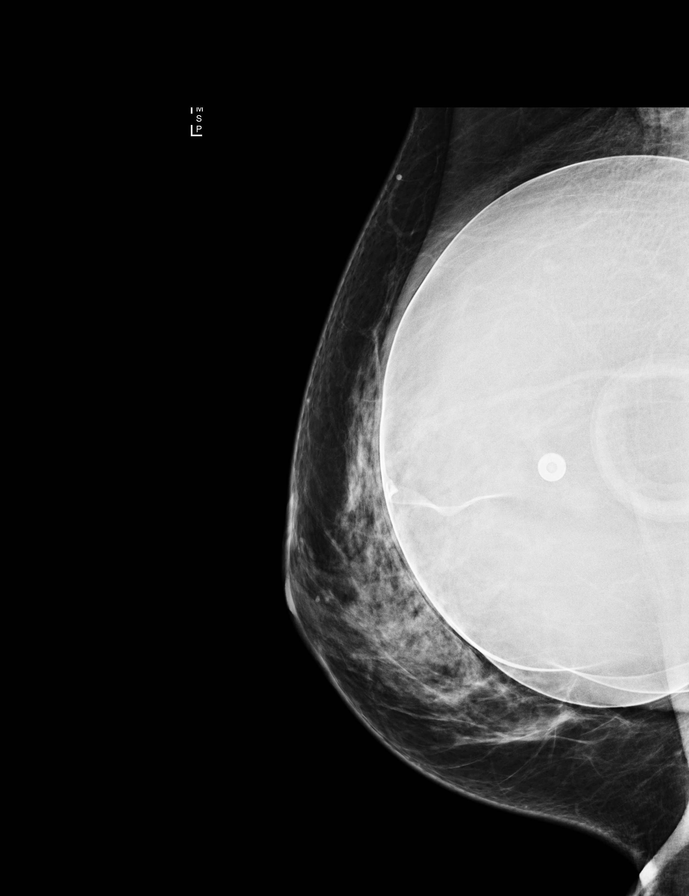

[L MLO (2 of 2)]
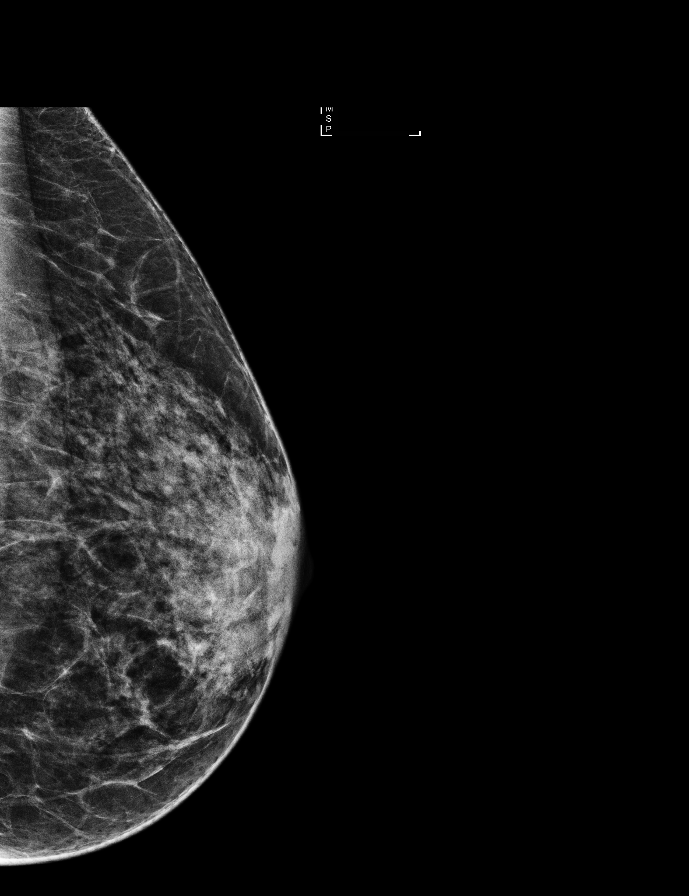

[R MLO (2 of 2)]
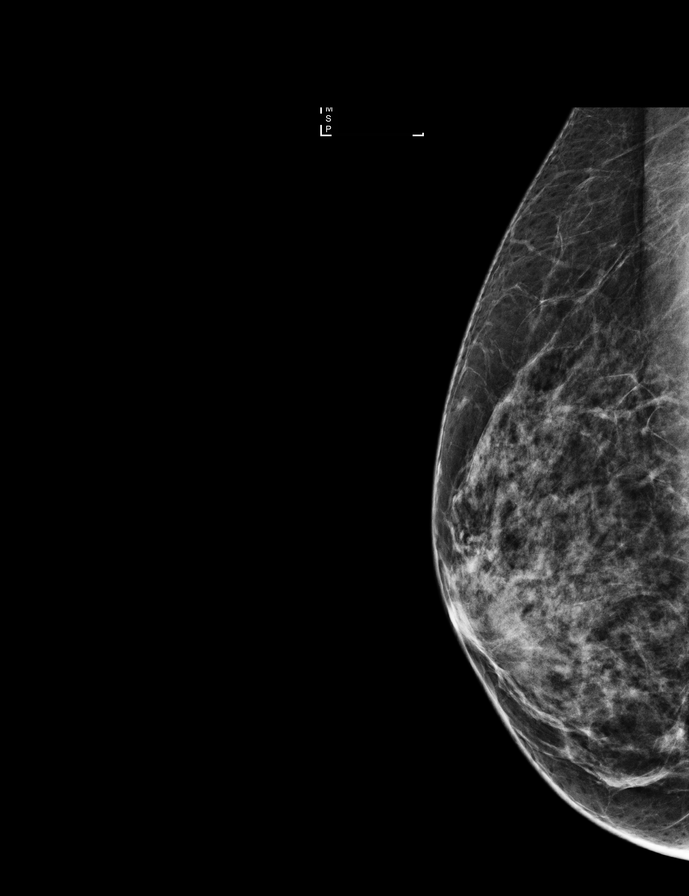

[R CC (2 of 2)]
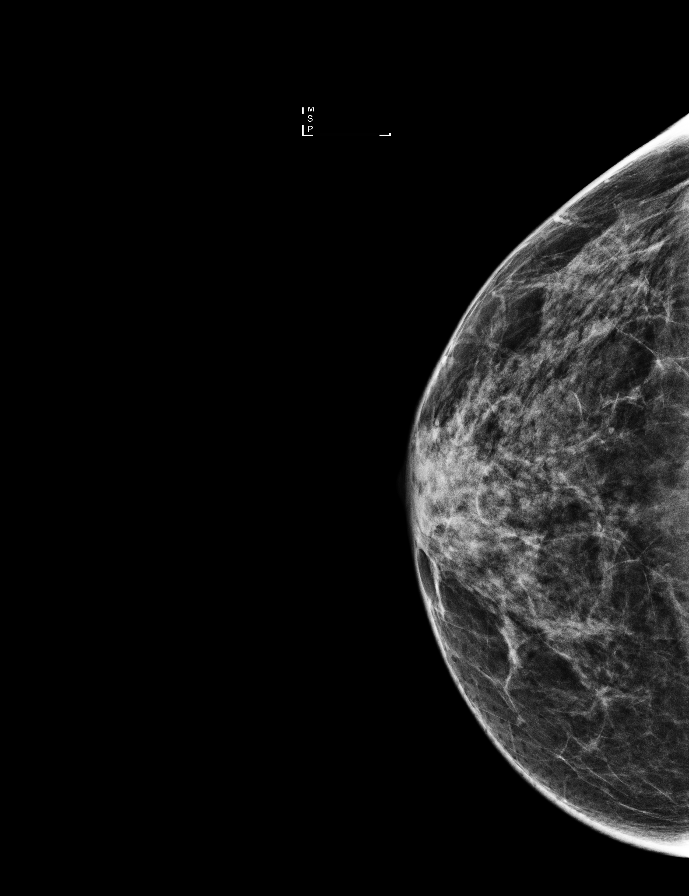

[L CC (2 of 2)]
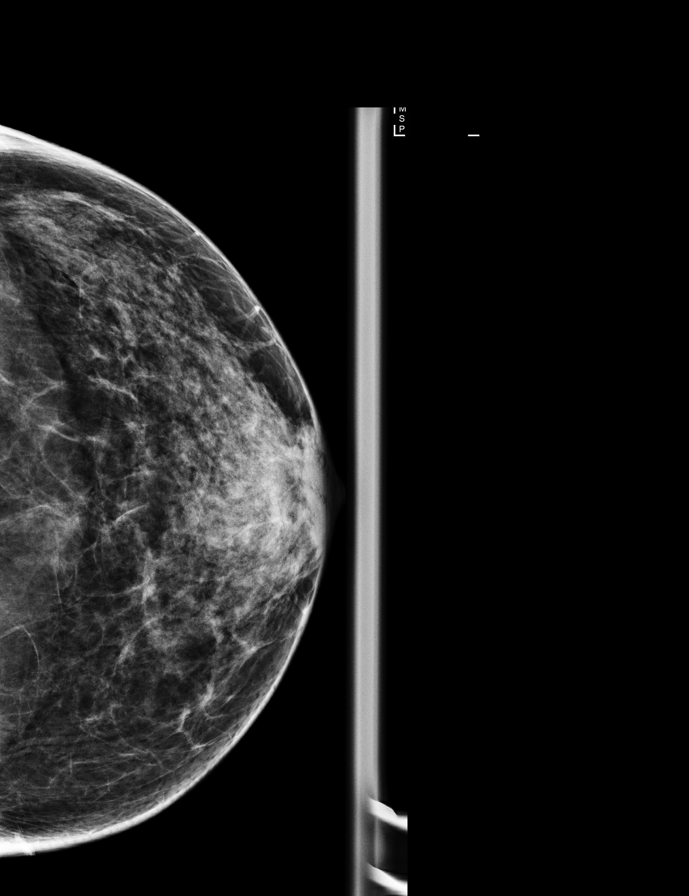

[L CCID BREAST TOMOSYNTHESIS IMAGE tomo · tomo slice 24/47.0]
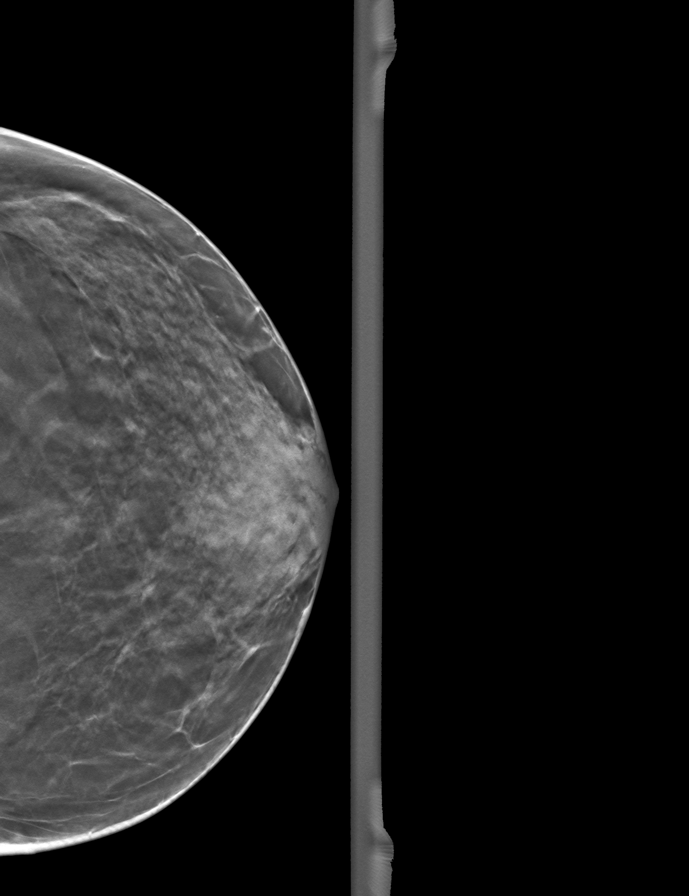

[9 of 28 positions shown; findings below may reference images not displayed]

ACR Breast Density Category c: The breast tissue is heterogeneously
dense, which may obscure small masses.
FINDINGS: The patient has retropectoral implants. There are no findings
suspicious for malignancy.
IMPRESSION: No mammographic evidence of malignancy. A result letter of this
screening mammogram will be mailed directly to the patient.

RECOMMENDATION:
Screening mammogram in one year. (Code:LT-E-7TH)

BI-RADS CATEGORY  1:  Negative.

## 2022-11-08 ENCOUNTER — Other Ambulatory Visit: Payer: Self-pay | Admitting: Adult Health

## 2022-12-14 ENCOUNTER — Other Ambulatory Visit: Payer: Self-pay | Admitting: Adult Health

## 2022-12-15 ENCOUNTER — Other Ambulatory Visit: Payer: Self-pay | Admitting: Obstetrics & Gynecology

## 2022-12-15 DIAGNOSIS — B379 Candidiasis, unspecified: Secondary | ICD-10-CM

## 2022-12-15 MED ORDER — FLUCONAZOLE 150 MG PO TABS: ORAL_TABLET | ORAL | 1 refills | Status: DC

## 2022-12-15 NOTE — Progress Notes (Signed)
Diflucan sent in

## 2023-01-10 ENCOUNTER — Other Ambulatory Visit (HOSPITAL_COMMUNITY): Payer: Self-pay | Admitting: Adult Health

## 2023-01-10 DIAGNOSIS — Z1231 Encounter for screening mammogram for malignant neoplasm of breast: Secondary | ICD-10-CM

## 2023-01-14 ENCOUNTER — Ambulatory Visit (HOSPITAL_COMMUNITY)
Admission: RE | Admit: 2023-01-14 | Discharge: 2023-01-14 | Disposition: A | Discharge status: Home / Self Care | Payer: BC Managed Care – PPO | Source: Ambulatory Visit | Attending: Adult Health | Admitting: Adult Health

## 2023-01-14 ENCOUNTER — Inpatient Hospital Stay (HOSPITAL_COMMUNITY): Admission: RE | Admit: 2023-01-14 | Payer: BC Managed Care – PPO | Source: Ambulatory Visit

## 2023-01-14 ENCOUNTER — Encounter (HOSPITAL_COMMUNITY): Payer: Self-pay

## 2023-01-14 DIAGNOSIS — Z1231 Encounter for screening mammogram for malignant neoplasm of breast: Secondary | ICD-10-CM | POA: Insufficient documentation

## 2023-01-18 ENCOUNTER — Other Ambulatory Visit (HOSPITAL_COMMUNITY): Payer: Self-pay | Admitting: Adult Health

## 2023-01-18 DIAGNOSIS — R928 Other abnormal and inconclusive findings on diagnostic imaging of breast: Secondary | ICD-10-CM

## 2023-01-20 ENCOUNTER — Ambulatory Visit (HOSPITAL_COMMUNITY)
Admission: RE | Admit: 2023-01-20 | Discharge: 2023-01-20 | Disposition: A | Discharge status: Home / Self Care | Payer: BC Managed Care – PPO | Source: Ambulatory Visit | Attending: Adult Health | Admitting: Adult Health

## 2023-01-20 DIAGNOSIS — R928 Other abnormal and inconclusive findings on diagnostic imaging of breast: Secondary | ICD-10-CM

## 2023-02-01 ENCOUNTER — Ambulatory Visit (HOSPITAL_COMMUNITY): Payer: BC Managed Care – PPO

## 2023-02-01 ENCOUNTER — Encounter (HOSPITAL_COMMUNITY): Payer: BC Managed Care – PPO

## 2023-04-27 ENCOUNTER — Other Ambulatory Visit: Payer: Self-pay | Admitting: Adult Health

## 2023-04-29 ENCOUNTER — Other Ambulatory Visit: Payer: Self-pay | Admitting: *Deleted

## 2023-04-29 MED ORDER — FLUCONAZOLE 150 MG PO TABS
ORAL_TABLET | ORAL | 2 refills | Status: DC
Start: 2023-04-29 — End: 2023-05-05

## 2023-05-05 ENCOUNTER — Ambulatory Visit (INDEPENDENT_AMBULATORY_CARE_PROVIDER_SITE_OTHER): Payer: BC Managed Care – PPO | Admitting: Adult Health

## 2023-05-05 ENCOUNTER — Encounter: Payer: Self-pay | Admitting: Adult Health

## 2023-05-05 ENCOUNTER — Other Ambulatory Visit (HOSPITAL_COMMUNITY)
Admission: RE | Admit: 2023-05-05 | Discharge: 2023-05-05 | Disposition: A | Discharge status: Home / Self Care | Payer: BC Managed Care – PPO | Source: Ambulatory Visit | Attending: Adult Health | Admitting: Adult Health

## 2023-05-05 VITALS — BP 141/93 | HR 83 | Ht 65.0 in | Wt 168.5 lb

## 2023-05-05 DIAGNOSIS — M542 Cervicalgia: Secondary | ICD-10-CM

## 2023-05-05 DIAGNOSIS — Z3041 Encounter for surveillance of contraceptive pills: Secondary | ICD-10-CM | POA: Diagnosis not present

## 2023-05-05 DIAGNOSIS — Z01419 Encounter for gynecological examination (general) (routine) without abnormal findings: Secondary | ICD-10-CM | POA: Diagnosis present

## 2023-05-05 DIAGNOSIS — R030 Elevated blood-pressure reading, without diagnosis of hypertension: Secondary | ICD-10-CM | POA: Diagnosis not present

## 2023-05-05 DIAGNOSIS — Z1211 Encounter for screening for malignant neoplasm of colon: Secondary | ICD-10-CM

## 2023-05-05 LAB — HEMOCCULT GUIAC POC 1CARD (OFFICE): Fecal Occult Blood, POC: NEGATIVE

## 2023-05-05 NOTE — Progress Notes (Signed)
Patient ID: Sharon Bush, female   DOB: [birth year], 42 y.o.   MRN: [id number] History of Present Illness: Sharon Bush  is a 42 year old white female,married, G0P0, in for a well woman gyn exam and pap.  PCP is Ronny Bacon NP    Current Medications, Allergies, Past Medical History, Past Surgical History, Family History and Social History were reviewed in [employer] record.     Review of Systems: Patient denies any headaches, hearing loss, fatigue, blurred vision, shortness of breath, chest pain, abdominal pain, problems with bowel movements, urination, or intercourse. No joint pain or mood swings. Happy with birth control pills, periods light Has pain in neck, taking flexeril   Physical Exam:BP (!) 141/93 (BP Location: Left Arm, Patient Position: Sitting, Cuff Size: Normal)   Pulse 83   Ht 5\' 5"  (1.651 m)   Wt 168 lb 8 oz (76.4 kg)   LMP 05/04/2023   BMI 28.04 kg/m   General:  Well developed, well nourished, no acute distress Skin:  Warm and dry Neck:  Midline trachea, normal thyroid, good ROM, no lymphadenopathy Lungs; Clear to auscultation bilaterally Breast:  No dominant palpable mass, retraction, or nipple discharge,has implants Has rub under right breast, where sports bra rubbed, ran 5 miles last night Cardiovascular: Regular rate and rhythm Abdomen:  Soft, non tender, no hepatosplenomegaly Pelvic:  External genitalia is normal in appearance, no lesions.  The vagina is normal in appearance,+dark blood. Urethra has no lesions or masses. The cervix is smooth, pap with HR HPV genotyping performed.  Uterus is felt to be normal size, shape, and contour.  No adnexal masses or tenderness noted.Bladder is non tender, no masses felt. Rectal: Good sphincter tone, no polyps, or hemorrhoids felt.  Hemoccult negative. Extremities/musculoskeletal:  No swelling or varicosities noted, no clubbing or cyanosis Psych:  No mood changes, alert and cooperative,seems happy AA is  1 Fall risk is low    05/05/2023    8:45 AM 05/03/2022    1:44 PM 05/11/2021    2:42 PM  Depression screen PHQ 2/9  Decreased Interest 0 0 0  Down, Depressed, Hopeless 0 0 0  PHQ - 2 Score 0 0 0  Altered sleeping 0 1 0  Tired, decreased energy 0 1 0  Change in appetite 0 1 1  Feeling bad or failure about yourself  0 0 0  Trouble concentrating 0 0 0  Moving slowly or fidgety/restless 0 0 0  Suicidal thoughts 0 0 0  PHQ-9 Score 0 3 1       05/05/2023    8:45 AM 05/03/2022    1:44 PM 05/11/2021    2:43 PM  GAD 7 : Generalized Anxiety Score  Nervous, Anxious, on Edge 0 1 0  Control/stop worrying 0 0 0  Worry too much - different things 0 0 1  Trouble relaxing 0 1 0  Restless 0 1 0  Easily annoyed or irritable 0 1 1  Afraid - awful might happen 0 0 0  Total GAD 7 Score 0 4 2      Upstream - 05/05/23 0850       Pregnancy Intention Screening   Does the patient want to become pregnant in the next year? No    Does the patient's partner want to become pregnant in the next year? No    Would the patient like to discuss contraceptive options today? No      Contraception Wrap Up   Current Method Oral Contraceptive  End Method Oral Contraceptive            Examination chaperoned by Malachy Mood LPN   Impression and Plan: 1. Encounter for gynecological examination with Papanicolaou smear of cervix Pap sent Pap in 3 years if normal Labs with PCP Mammogram showed asymmetry 01/14/23 follow up was 01/31/23 cysts left breast, get mammogram 2025 - Cytology - PAP( [facility]) Stay active  2. Encounter for screening fecal occult blood testing Hemoccult was negative  - POCT occult blood stool  3. Encounter for surveillance of contraceptive pills Happy with mibelas 24 fe, has refills   4. Elevated BP without diagnosis of hypertension BP was normal at PCP 04/01/23 128/80  5. Neck pain, musculoskeletal Taking flexeril Try heat and ice and get a massage

## 2023-05-10 LAB — CYTOLOGY - PAP
Adequacy: ABSENT
Comment: NEGATIVE
Diagnosis: NEGATIVE
High risk HPV: NEGATIVE

## 2023-09-06 ENCOUNTER — Ambulatory Visit: Payer: BC Managed Care – PPO | Admitting: Adult Health

## 2023-09-06 ENCOUNTER — Other Ambulatory Visit: Payer: BC Managed Care – PPO

## 2023-09-06 ENCOUNTER — Encounter: Payer: Self-pay | Admitting: Adult Health

## 2023-09-06 VITALS — BP 145/90 | HR 99 | Ht 65.0 in | Wt 163.0 lb

## 2023-09-06 DIAGNOSIS — N6321 Unspecified lump in the left breast, upper outer quadrant: Secondary | ICD-10-CM | POA: Diagnosis not present

## 2023-09-06 NOTE — Progress Notes (Signed)
Subjective:     Patient ID: Sharon Bush, female   DOB: [birth year], 42 y.o.   MRN: [id number]  HPI Christne is a 41 year old white female, married,G0P0, in having felt mass in left breast Sunday. She is leaving for [place] in am. She has 2 know small cysts at 12 0' clock from diagnostic mammogram and [place] 01/20/23.     Component Value Date/Time   DIAGPAP  05/05/2023 0851    - Negative for intraepithelial lesion or malignancy (NILM)   DIAGPAP  01/24/2020 1408    - Negative for intraepithelial lesion or malignancy (NILM)   DIAGPAP  07/21/2017 0000    NEGATIVE FOR INTRAEPITHELIAL LESIONS OR MALIGNANCY.   DIAGPAP  07/21/2017 0000    FUNGAL ORGANISMS PRESENT CONSISTENT WITH CANDIDA SPP.   HPVHIGH Negative 05/05/2023 0851   HPVHIGH Negative 01/24/2020 1408   ADEQPAP  05/05/2023 0851    Satisfactory for evaluation; transformation zone component ABSENT.   ADEQPAP  01/24/2020 1408    Satisfactory for evaluation; transformation zone component ABSENT.   ADEQPAP  07/21/2017 0000    Satisfactory for evaluation  endocervical/transformation zone component ABSENT.    PCP is Ronny Bacon NP  Review of Systems Mass left breast Reviewed past medical,surgical, social and family history. Reviewed medications and allergies.     Objective:   Physical Exam BP (!) 145/90 (BP Location: Right Arm, Patient Position: Sitting, Cuff Size: Normal)   Pulse 99   Ht 5\' 5"  (1.651 m)   Wt 163 lb (73.9 kg)   LMP 08/23/2023 (Approximate)   BMI 27.12 kg/m      Skin warm and dry,  Breasts:no dominate palpable mass, retraction or nipple discharge on the right, on the left, she has not retraction or nipple discharge, but has about 1 cm mass at 1 o'clock 3 FB from nipple, and has known cysts at 12 o'clock. Mildly tender and she has bilateral implants. She is teary today. Fall risk is low  Upstream - 09/06/23 1025       Pregnancy Intention Screening   Does the patient want to become pregnant in the next year? No     Does the patient's partner want to become pregnant in the next year? No    Would the patient like to discuss contraceptive options today? No      Contraception Wrap Up   Current Method Oral Contraceptive    End Method Oral Contraceptive    Contraception Counseling Provided Yes             Assessment:     1. Mass of upper outer quadrant of left breast Has mass at 1 0'clock Has know cysts at 12 0'clock Mammogram and [place] scheduled for 09/12/23 at 1:10 pm at the [facility] in [place], she can call to see if any cancellations today - [place] LIMITED ULTRASOUND INCLUDING AXILLA LEFT BREAST ; Future - MM 3D DIAGNOSTIC MAMMOGRAM UNILATERAL LEFT BREAST W/IMPLANT; Future     Plan:     Follow up prn

## 2023-09-12 ENCOUNTER — Ambulatory Visit
Admission: RE | Admit: 2023-09-12 | Discharge: 2023-09-12 | Disposition: A | Discharge status: Home / Self Care | Payer: BC Managed Care – PPO | Source: Ambulatory Visit | Attending: Adult Health | Admitting: Adult Health

## 2023-09-12 ENCOUNTER — Other Ambulatory Visit: Payer: BC Managed Care – PPO

## 2023-09-12 ENCOUNTER — Other Ambulatory Visit: Payer: Self-pay | Admitting: Adult Health

## 2023-09-12 DIAGNOSIS — N6321 Unspecified lump in the left breast, upper outer quadrant: Secondary | ICD-10-CM

## 2023-09-19 ENCOUNTER — Other Ambulatory Visit: Payer: BC Managed Care – PPO

## 2023-09-22 ENCOUNTER — Ambulatory Visit
Admission: RE | Admit: 2023-09-22 | Discharge: 2023-09-22 | Disposition: A | Discharge status: Home / Self Care | Payer: BC Managed Care – PPO | Source: Ambulatory Visit | Attending: Adult Health | Admitting: Adult Health

## 2023-09-22 DIAGNOSIS — N6321 Unspecified lump in the left breast, upper outer quadrant: Secondary | ICD-10-CM

## 2023-09-22 HISTORY — PX: BREAST BIOPSY: SHX20

## 2023-09-23 LAB — SURGICAL PATHOLOGY

## 2023-10-17 ENCOUNTER — Other Ambulatory Visit: Payer: Self-pay | Admitting: Adult Health

## 2023-12-22 ENCOUNTER — Other Ambulatory Visit: Payer: Self-pay | Admitting: Adult Health

## 2023-12-22 DIAGNOSIS — Z1231 Encounter for screening mammogram for malignant neoplasm of breast: Secondary | ICD-10-CM

## 2024-01-16 ENCOUNTER — Ambulatory Visit
Admission: RE | Admit: 2024-01-16 | Discharge: 2024-01-16 | Disposition: A | Discharge status: Home / Self Care | Payer: 59 | Source: Ambulatory Visit | Attending: Adult Health | Admitting: Adult Health

## 2024-01-16 ENCOUNTER — Other Ambulatory Visit: Payer: Self-pay | Admitting: Adult Health

## 2024-01-16 DIAGNOSIS — Z1231 Encounter for screening mammogram for malignant neoplasm of breast: Secondary | ICD-10-CM

## 2024-03-21 ENCOUNTER — Other Ambulatory Visit: Payer: Self-pay | Admitting: Adult Health

## 2024-05-08 ENCOUNTER — Ambulatory Visit: Admitting: Adult Health

## 2024-05-08 ENCOUNTER — Encounter: Payer: Self-pay | Admitting: Adult Health

## 2024-05-08 VITALS — BP 159/99 | Ht 65.0 in | Wt 162.0 lb

## 2024-05-08 DIAGNOSIS — Z3041 Encounter for surveillance of contraceptive pills: Secondary | ICD-10-CM

## 2024-05-08 DIAGNOSIS — Z01419 Encounter for gynecological examination (general) (routine) without abnormal findings: Secondary | ICD-10-CM

## 2024-05-08 DIAGNOSIS — Z1331 Encounter for screening for depression: Secondary | ICD-10-CM | POA: Diagnosis not present

## 2024-05-08 DIAGNOSIS — Z1211 Encounter for screening for malignant neoplasm of colon: Secondary | ICD-10-CM | POA: Diagnosis not present

## 2024-05-08 DIAGNOSIS — I10 Essential (primary) hypertension: Secondary | ICD-10-CM | POA: Diagnosis not present

## 2024-05-08 LAB — HEMOCCULT GUIAC POC 1CARD (OFFICE): Fecal Occult Blood, POC: NEGATIVE

## 2024-05-08 MED ORDER — NORETHINDRONE 0.35 MG PO TABS: 1.0000 | ORAL_TABLET | Freq: Every day | ORAL | 11 refills | Status: DC

## 2024-05-08 NOTE — Progress Notes (Signed)
 Patient ID: Sharon Bush, female   DOB: 1981-01-24, 43 y.o.   MRN: [id number] History of Present Illness: Sharon Bush is a 43 year old white female, married, G0P0, in for a well woman gyn exam. 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     Component Value Date/Time   DIAGPAP  05/05/2023 0851    - Negative for intraepithelial lesion or malignancy (NILM)   DIAGPAP  01/24/2020 1408    - Negative for intraepithelial lesion or malignancy (NILM)   DIAGPAP  07/21/2017 0000    NEGATIVE FOR INTRAEPITHELIAL LESIONS OR MALIGNANCY.   DIAGPAP  07/21/2017 0000    FUNGAL ORGANISMS PRESENT CONSISTENT WITH CANDIDA SPP.   HPVHIGH Negative 05/05/2023 0851   HPVHIGH Negative 01/24/2020 1408   ADEQPAP  05/05/2023 0851    Satisfactory for evaluation; transformation zone component ABSENT.   ADEQPAP  01/24/2020 1408    Satisfactory for evaluation; transformation zone component ABSENT.   ADEQPAP  07/21/2017 0000    Satisfactory for evaluation  endocervical/transformation zone component ABSENT.    PCP is Asberry Lav NP   Current Medications, Allergies, Past Medical History, Past Surgical History, Family History and Social History were reviewed in [employer] record.     Review of Systems: Patient denies any headaches, hearing loss, fatigue, blurred vision, shortness of breath, chest pain, abdominal pain, problems with bowel movements, urination, or intercourse. No joint pain or mood swings.     Physical Exam:BP (!) 159/99 (BP Location: Left Arm, Patient Position: Sitting, Cuff Size: Normal)   Ht 5\' 5"  (1.651 m)   Wt 162 lb (73.5 kg)   BMI 26.96 kg/m   General:  Well developed, well nourished, no acute distress Skin:  Warm and dry Neck:  Midline trachea, normal thyroid, good ROM, no lymphadenopathy Lungs; Clear to auscultation bilaterally Breast:  No dominant palpable mass, retraction, or nipple discharge, has bilateral implants Cardiovascular: Regular rate and rhythm Abdomen:  Soft, non  tender, no hepatosplenomegaly Pelvic:  External genitalia is normal in appearance, no lesions.  The vagina is normal in appearance. Urethra has no lesions or masses. The cervix is smooth.  Uterus is felt to be normal size, shape, and contour.  No adnexal masses or tenderness noted.Bladder is non tender, no masses felt. Rectal: Good sphincter tone, no polyps, + hemorrhoids felt.  Hemoccult negative. Extremities/musculoskeletal:  No swelling or varicosities noted, no clubbing or cyanosis Psych:  No mood changes, alert and cooperative,seems happy AA is 2 Fall risk is low    05/08/2024    3:25 PM 05/05/2023    8:45 AM 05/03/2022    1:44 PM  Depression screen PHQ 2/9  Decreased Interest 0 0 0  Down, Depressed, Hopeless 0 0 0  PHQ - 2 Score 0 0 0  Altered sleeping 0 0 1  Tired, decreased energy 0 0 1  Change in appetite 0 0 1  Feeling bad or failure about yourself  0 0 0  Trouble concentrating 0 0 0  Moving slowly or fidgety/restless 0 0 0  Suicidal thoughts 0 0 0  PHQ-9 Score 0 0 3       05/08/2024    3:25 PM 05/05/2023    8:45 AM 05/03/2022    1:44 PM 05/11/2021    2:43 PM  GAD 7 : Generalized Anxiety Score  Nervous, Anxious, on Edge 0 0 1 0  Control/stop worrying 0 0 0 0  Worry too much - different things 0 0 0 1  Trouble relaxing 0 0  1 0  Restless 0 0 1 0  Easily annoyed or irritable 0 0 1 1  Afraid - awful might happen 0 0 0 0  Total GAD 7 Score 0 0 4 2    Upstream - 05/08/24 1537       Pregnancy Intention Screening   Does the patient want to become pregnant in the next year? No    Does the patient's partner want to become pregnant in the next year? No    Would the patient like to discuss contraceptive options today? No      Contraception Wrap Up   Current Method Oral Contraceptive    End Method Oral Contraceptive            Examination chaperoned by Wendell Halt RN    Impression and plan: 1. Encounter for well woman exam with routine gynecological exam  (Primary) Physical in 1 year Pap in 2027 Mammogram was negative 01/16/24 Labs with PCP  2. Encounter for screening fecal occult blood testing Hemoccult was negative  - POCT occult blood stool  3. Encounter for surveillance of contraceptive pills Stop mibelas and start Micronor  Use condoms for 1 pack Meds ordered this encounter  Medications   norethindrone (MICRONOR) 0.35 MG tablet    Sig: Take 1 tablet (0.35 mg total) by mouth daily.    Dispense:  28 tablet    Refill:  11    Supervising Provider:   Randolm Butte, LUTHER H [[id number]]     4. Essential hypertension Take BP meds and keep check on BP at home Follow up in 8 weeks for BP check and ROS on POP

## 2024-07-03 ENCOUNTER — Encounter: Payer: Self-pay | Admitting: Adult Health

## 2024-07-03 ENCOUNTER — Ambulatory Visit: Admitting: Adult Health

## 2024-07-03 VITALS — BP 135/87 | HR 89 | Ht 65.5 in | Wt 153.0 lb

## 2024-07-03 DIAGNOSIS — I10 Essential (primary) hypertension: Secondary | ICD-10-CM | POA: Diagnosis not present

## 2024-07-03 DIAGNOSIS — Z3041 Encounter for surveillance of contraceptive pills: Secondary | ICD-10-CM | POA: Diagnosis not present

## 2024-07-03 NOTE — Progress Notes (Signed)
  Subjective:     Patient ID: Sharon Bush, female   DOB: [birth year], 43 y.o.   MRN: [id number]  HPI Abrie is a 43 year old white female, married, G0P0, in for BP check and ROS after stopping OC and starting Micronor  and BP is better, no periods with Micronor , has taken 5 HPTs and all negative. She is Ok with no periods.     Component Value Date/Time   DIAGPAP  05/05/2023 0851    - Negative for intraepithelial lesion or malignancy (NILM)   DIAGPAP  01/24/2020 1408    - Negative for intraepithelial lesion or malignancy (NILM)   DIAGPAP  07/21/2017 0000    NEGATIVE FOR INTRAEPITHELIAL LESIONS OR MALIGNANCY.   DIAGPAP  07/21/2017 0000    FUNGAL ORGANISMS PRESENT CONSISTENT WITH CANDIDA SPP.   HPVHIGH Negative 05/05/2023 0851   HPVHIGH Negative 01/24/2020 1408   ADEQPAP  05/05/2023 0851    Satisfactory for evaluation; transformation zone component ABSENT.   ADEQPAP  01/24/2020 1408    Satisfactory for evaluation; transformation zone component ABSENT.   ADEQPAP  07/21/2017 0000    Satisfactory for evaluation  endocervical/transformation zone component ABSENT.   PCP is  C Keatts NP Review of Systems No periods on Micronor , had negative HPTs BP is better Reviewed past medical,surgical, social and family history. Reviewed medications and allergies.     Objective:   Physical Exam BP 135/87 (BP Location: Left Arm, Patient Position: Sitting, Cuff Size: Normal)   Pulse 89   Ht 5' 5.5 (1.664 m)   Wt 153 lb (69.4 kg)   LMP 05/12/2024 (Exact Date)   BMI 25.07 kg/m     Skin warm and dry.  Lungs: clear to ausculation bilaterally. Cardiovascular: regular rate and rhythm.   Upstream - 07/03/24 0843       Pregnancy Intention Screening   Does the patient want to become pregnant in the next year? No    Does the patient's partner want to become pregnant in the next year? No    Would the patient like to discuss contraceptive options today? No      Contraception Wrap Up   Current  Method Oral Contraceptive    End Method Oral Contraceptive    Contraception Counseling Provided Yes          Assessment:     1. Essential hypertension (Primary) BP is better Continue Toprol XL 25 mg 1 daily  and Norvasc 2.5 mg 1 daily  and follow up with PCP  2. Encounter for surveillance of contraceptive pills No periods on Micronor  and BP better, will continue, has refills      Plan:     Follow up in 6 months for ROS

## 2024-09-24 ENCOUNTER — Other Ambulatory Visit: Payer: Self-pay | Admitting: Adult Health

## 2024-09-24 MED ORDER — FLUCONAZOLE 150 MG PO TABS: ORAL_TABLET | ORAL | 1 refills | Status: AC

## 2024-09-24 NOTE — Progress Notes (Signed)
 Rx diflucan.

## 2024-12-14 ENCOUNTER — Other Ambulatory Visit: Payer: Self-pay | Admitting: Adult Health

## 2024-12-14 DIAGNOSIS — Z1231 Encounter for screening mammogram for malignant neoplasm of breast: Secondary | ICD-10-CM

## 2025-01-15 ENCOUNTER — Other Ambulatory Visit: Payer: Self-pay | Admitting: Adult Health

## 2025-01-15 MED ORDER — NORETHINDRONE 0.35 MG PO TABS: 1.0000 | ORAL_TABLET | Freq: Every day | ORAL | 11 refills | Status: AC

## 2025-01-15 NOTE — Progress Notes (Signed)
 Rx sent for micronor  to [facility] in [facility]

## 2025-01-16 ENCOUNTER — Ambulatory Visit

## 2025-01-24 ENCOUNTER — Ambulatory Visit

## 2025-01-29 ENCOUNTER — Ambulatory Visit
